# Patient Record
Sex: Female | Born: 1965 | Race: White | Hispanic: No | Smoking: Never smoker
Health system: Southern US, Community
[De-identification: ages and names within clinical notes are randomized; demographics above are authoritative.]

## PROBLEM LIST (undated history)

## (undated) DIAGNOSIS — F32A Depression, unspecified: Secondary | ICD-10-CM

## (undated) DIAGNOSIS — I472 Ventricular tachycardia, unspecified: Secondary | ICD-10-CM

## (undated) DIAGNOSIS — F329 Major depressive disorder, single episode, unspecified: Secondary | ICD-10-CM

## (undated) DIAGNOSIS — F419 Anxiety disorder, unspecified: Secondary | ICD-10-CM

## (undated) HISTORY — PX: KNEE ARTHROSCOPY: SUR90

## (undated) HISTORY — DX: Depression, unspecified: F32.A

## (undated) HISTORY — PX: LIPOSUCTION: SHX10

## (undated) HISTORY — DX: Anxiety disorder, unspecified: F41.9

---
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
## 2001-02-25 ENCOUNTER — Other Ambulatory Visit: Admission: RE | Admit: 2001-02-25 | Discharge: 2001-02-25 | Payer: Self-pay | Admitting: *Deleted

## 2004-08-20 HISTORY — PX: TUBAL LIGATION: SHX77

## 2008-08-20 HISTORY — PX: TONSILECTOMY, ADENOIDECTOMY, BILATERAL MYRINGOTOMY AND TUBES: SHX2538

## 2009-09-14 ENCOUNTER — Ambulatory Visit (HOSPITAL_BASED_OUTPATIENT_CLINIC_OR_DEPARTMENT_OTHER): Admission: RE | Admit: 2009-09-14 | Discharge: 2009-09-14 | Payer: Self-pay | Admitting: Orthopedic Surgery

## 2009-09-15 ENCOUNTER — Ambulatory Visit: Payer: Self-pay | Admitting: Surgery

## 2009-09-15 ENCOUNTER — Ambulatory Visit (HOSPITAL_COMMUNITY): Admission: RE | Admit: 2009-09-15 | Discharge: 2009-09-15 | Payer: Self-pay | Admitting: Orthopedic Surgery

## 2009-09-15 ENCOUNTER — Encounter (INDEPENDENT_AMBULATORY_CARE_PROVIDER_SITE_OTHER): Payer: Self-pay | Admitting: Orthopedic Surgery

## 2010-11-06 LAB — POCT HEMOGLOBIN-HEMACUE: Hemoglobin: 12.8 g/dL (ref 12.0–15.0)

## 2017-12-13 ENCOUNTER — Other Ambulatory Visit: Payer: Self-pay

## 2017-12-13 ENCOUNTER — Emergency Department (HOSPITAL_COMMUNITY)
Admission: EM | Admit: 2017-12-13 | Discharge: 2017-12-14 | Disposition: A | Discharge status: Home / Self Care | Payer: Medicaid Other | Attending: Emergency Medicine | Admitting: Emergency Medicine

## 2017-12-13 ENCOUNTER — Encounter (HOSPITAL_COMMUNITY): Payer: Self-pay | Admitting: Emergency Medicine

## 2017-12-13 DIAGNOSIS — Y999 Unspecified external cause status: Secondary | ICD-10-CM | POA: Insufficient documentation

## 2017-12-13 DIAGNOSIS — X58XXXA Exposure to other specified factors, initial encounter: Secondary | ICD-10-CM | POA: Diagnosis not present

## 2017-12-13 DIAGNOSIS — Y939 Activity, unspecified: Secondary | ICD-10-CM | POA: Insufficient documentation

## 2017-12-13 DIAGNOSIS — H5711 Ocular pain, right eye: Secondary | ICD-10-CM | POA: Diagnosis present

## 2017-12-13 DIAGNOSIS — S0502XA Injury of conjunctiva and corneal abrasion without foreign body, left eye, initial encounter: Secondary | ICD-10-CM

## 2017-12-13 DIAGNOSIS — Y929 Unspecified place or not applicable: Secondary | ICD-10-CM | POA: Diagnosis not present

## 2017-12-13 MED ORDER — TETRACAINE HCL 0.5 % OP SOLN
2.0000 [drp] | Freq: Once | OPHTHALMIC | Status: AC
  Administered 2017-12-14: 2 [drp] via OPHTHALMIC
  Filled 2017-12-13: qty 4

## 2017-12-13 MED ORDER — FLUORESCEIN SODIUM 1 MG OP STRP
1.0000 | ORAL_STRIP | Freq: Once | OPHTHALMIC | Status: AC
  Administered 2017-12-14: 1 via OPHTHALMIC
  Filled 2017-12-13: qty 1

## 2017-12-13 MED ORDER — GENTAMICIN SULFATE 0.3 % OP SOLN: 2.0000 [drp] | Freq: Four times a day (QID) | OPHTHALMIC | 0 refills | Status: DC

## 2017-12-13 NOTE — ED Triage Notes (Signed)
Patient thinks a paper towel brushed her eye. Now she has pain in left eye. Patient is in pain.

## 2017-12-13 NOTE — ED Provider Notes (Signed)
[facility]-EMERGENCY DEPT Provider Note   CSN: [id number] Arrival date & time: 12/13/17  2046     History   Chief Complaint Chief Complaint  Patient presents with  . Eye Pain    HPI Nashaly Dorantes is a 52 y.o. female.  Patient is a 52 year old female with no significant past medical history.  She presents for evaluation of left eye pain and irritation.  This started yesterday.  She believes that she may have scratched her eye with a paper towel.  She denies any visual disturbances.  The history is provided by the patient.  Eye Pain  This is a new problem. The current episode started yesterday. The problem occurs constantly. The problem has been rapidly worsening. Nothing aggravates the symptoms. Nothing relieves the symptoms. She has tried nothing for the symptoms.    History reviewed. No pertinent past medical history.  There are no active problems to display for this patient.   History reviewed. No pertinent surgical history.   OB History   None      Home Medications    Prior to Admission medications   Not on File    Family History History reviewed. No pertinent family history.  Social History Social History   Tobacco Use  . Smoking status: Never Smoker  . Smokeless tobacco: Never Used  Substance Use Topics  . Alcohol use: Never    Frequency: Never  . Drug use: Never     Allergies   Patient has no known allergies.   Review of Systems Review of Systems  Eyes: Positive for pain.  All other systems reviewed and are negative.    Physical Exam Updated Vital Signs BP (!) 141/83 (BP Location: Left Arm)   Pulse 95   Temp 98 F (36.7 C) (Oral)   Resp 18   Ht 5\' 7"  (1.702 m)   Wt 68 kg (150 lb)   SpO2 100%   BMI 23.49 kg/m   Physical Exam  Constitutional: She appears well-developed and well-nourished. No distress.  HENT:  Head: Normocephalic and atraumatic.  Eyes: Pupils are equal, round, and reactive to light. EOM  are normal.  The left conjunctiva is injected.  The cornea appears clear, however with fluorescein staining an abrasion is noted at approximately 5:00 on the cornea.  The anterior taper is clear.  Pupils are reactive.  Pulmonary/Chest: Effort normal.  Skin: Skin is warm. She is not diaphoretic.  Nursing note and vitals reviewed.    ED Treatments / Results  Labs (all labs ordered are listed, but only abnormal results are displayed) Labs Reviewed - No data to display  EKG None  Radiology No results found.  Procedures Procedures (including critical care time)  Medications Ordered in ED Medications  tetracaine (PONTOCAINE) 0.5 % ophthalmic solution 2 drop (has no administration in time range)  fluorescein ophthalmic strip 1 strip (has no administration in time range)     Initial Impression / Assessment and Plan / ED Course  I have reviewed the triage vital signs and the nursing notes.  Pertinent labs & imaging results that were available during my care of the patient were reviewed by me and considered in my medical decision making (see chart for details).  Patient with a corneal abrasion.  This will be treated with gentamicin and anti-inflammatory medications.  To follow-up as needed if she worsens.  Final Clinical Impressions(s) / ED Diagnoses   Final diagnoses:  None    ED Discharge Orders    None  Geoffery Lyonselo, Skyrah Krupp, MD 12/13/17 [phone]

## 2017-12-13 NOTE — Discharge Instructions (Addendum)
Gentamicin drops as prescribed.  Ibuprofen 600 mg every 6 hours as needed for pain.  Follow-up with your eye doctor if symptoms are not improving in the next 2 to 3 days, and return to the ER if symptoms significantly worsen or change.

## 2018-08-25 ENCOUNTER — Other Ambulatory Visit: Payer: Self-pay | Admitting: Obstetrics and Gynecology

## 2018-08-25 ENCOUNTER — Encounter: Payer: Self-pay | Admitting: Obstetrics and Gynecology

## 2018-08-25 ENCOUNTER — Other Ambulatory Visit (HOSPITAL_COMMUNITY)
Admission: RE | Admit: 2018-08-25 | Discharge: 2018-08-25 | Disposition: A | Discharge status: Home / Self Care | Payer: Medicaid Other | Source: Ambulatory Visit | Attending: Obstetrics and Gynecology | Admitting: Obstetrics and Gynecology

## 2018-08-25 ENCOUNTER — Ambulatory Visit: Payer: Medicaid Other | Admitting: Obstetrics and Gynecology

## 2018-08-25 VITALS — BP 116/82 | Ht 66.0 in | Wt 161.9 lb

## 2018-08-25 DIAGNOSIS — Z01419 Encounter for gynecological examination (general) (routine) without abnormal findings: Secondary | ICD-10-CM | POA: Diagnosis not present

## 2018-08-25 DIAGNOSIS — Z1231 Encounter for screening mammogram for malignant neoplasm of breast: Secondary | ICD-10-CM

## 2018-08-25 DIAGNOSIS — Z0000 Encounter for general adult medical examination without abnormal findings: Secondary | ICD-10-CM

## 2018-08-25 NOTE — Progress Notes (Signed)
Pt presents for annual, pap, all STD testing.  Last MGM 2012 Never had colonoscopy

## 2018-08-25 NOTE — Progress Notes (Signed)
Subjective:     Marissa Barnes is a 53 y.o. female with BMI 26 and postmenopausal for the past 2 years who is here for a comprehensive physical exam. The patient reports no problems. She is occasionally sexually active without complaints. She denies pelvic pain or abnormal discharge. She denies urinary incontinence. She denies any postmenopausal vaginal bleeding  History reviewed. No pertinent past medical history. Past Surgical History:  Procedure Laterality Date  . LIPOSUCTION    . TONSILECTOMY, ADENOIDECTOMY, BILATERAL MYRINGOTOMY AND TUBES  2010  . TUBAL LIGATION  2006   Family History  Problem Relation Age of Onset  . Atrial fibrillation Mother   . Diabetes Mother   . Amblyopia Mother   . Breast cancer Mother   . Heart disease Father   . Breast cancer Sister     Social History   Socioeconomic History  . Marital status: Legally Separated    Spouse name: Not on file  . Number of children: Not on file  . Years of education: Not on file  . Highest education level: Not on file  Occupational History  . Not on file  Social Needs  . Financial resource strain: Not on file  . Food insecurity:    Worry: Not on file    Inability: Not on file  . Transportation needs:    Medical: Not on file    Non-medical: Not on file  Tobacco Use  . Smoking status: Never Smoker  . Smokeless tobacco: Never Used  Substance and Sexual Activity  . Alcohol use: Never    Frequency: Never  . Drug use: Never  . Sexual activity: Not Currently    Birth control/protection: Surgical  Lifestyle  . Physical activity:    Days per week: Not on file    Minutes per session: Not on file  . Stress: Not on file  Relationships  . Social connections:    Talks on phone: Not on file    Gets together: Not on file    Attends religious service: Not on file    Active member of club or organization: Not on file    Attends meetings of clubs or organizations: Not on file    Relationship status: Not on file  .  Intimate partner violence:    Fear of current or ex partner: Not on file    Emotionally abused: Not on file    Physically abused: Not on file    Forced sexual activity: Not on file  Other Topics Concern  . Not on file  Social History Narrative  . Not on file   Health Maintenance  Topic Date Due  . HIV Screening  10/30/1980  . TETANUS/TDAP  10/30/1984  . PAP SMEAR-Modifier  10/31/1986  . MAMMOGRAM  10/31/2015  . COLONOSCOPY  10/31/2015  . INFLUENZA VACCINE  03/20/2018    Review of Systems Pertinent items are noted in HPI.   Objective:  Blood pressure 116/82, height 5\' 6"  (1.676 m), weight 161 lb 14.4 oz (73.4 kg).     GENERAL: Well-developed, well-nourished female in no acute distress.  HEENT: Normocephalic, atraumatic. Sclerae anicteric.  NECK: Supple. Normal thyroid.  LUNGS: Clear to auscultation bilaterally.  HEART: Regular rate and rhythm. BREASTS: Symmetric in size. No palpable masses or lymphadenopathy, skin changes, or nipple drainage. ABDOMEN: Soft, nontender, nondistended. No organomegaly. PELVIC: Normal external female genitalia. Vagina is pink and rugated.  Normal discharge. Normal appearing cervix. Uterus is normal in size. No adnexal mass or tenderness. EXTREMITIES: No cyanosis, clubbing,  or edema, 2+ distal pulses.    Assessment:    Healthy female exam.      Plan:    pap smear collected Screening mammogram ordered Referral to GI for colonoscopy made STI screening per patient request Patient will be contacted with abnormal results See After Visit Summary for Counseling Recommendations

## 2018-08-25 NOTE — Addendum Note (Signed)
Addended by: Catalina Antigua on: 08/25/2018 01:57 PM   Modules accepted: Orders

## 2018-08-26 LAB — COMPREHENSIVE METABOLIC PANEL WITH GFR
ALT: 14 [IU]/L (ref 0–32)
AST: 18 [IU]/L (ref 0–40)
Albumin/Globulin Ratio: 1.8 (ref 1.2–2.2)
Albumin: 4.5 g/dL (ref 3.5–5.5)
Alkaline Phosphatase: 121 [IU]/L — ABNORMAL HIGH (ref 39–117)
BUN/Creatinine Ratio: 16 (ref 9–23)
BUN: 11 mg/dL (ref 6–24)
Bilirubin Total: 0.3 mg/dL (ref 0.0–1.2)
CO2: 24 mmol/L (ref 20–29)
Calcium: 9.3 mg/dL (ref 8.7–10.2)
Chloride: 103 mmol/L (ref 96–106)
Creatinine, Ser: 0.7 mg/dL (ref 0.57–1.00)
GFR calc Af Amer: 115 mL/min/{1.73_m2}
GFR calc non Af Amer: 100 mL/min/{1.73_m2}
Globulin, Total: 2.5 g/dL (ref 1.5–4.5)
Glucose: 91 mg/dL (ref 65–99)
Potassium: 4.2 mmol/L (ref 3.5–5.2)
Sodium: 142 mmol/L (ref 134–144)
Total Protein: 7 g/dL (ref 6.0–8.5)

## 2018-08-26 LAB — HIV ANTIBODY (ROUTINE TESTING W REFLEX): HIV Screen 4th Generation wRfx: NONREACTIVE

## 2018-08-26 LAB — HEPATITIS B SURFACE ANTIGEN: Hepatitis B Surface Ag: NEGATIVE

## 2018-08-26 LAB — CBC
Hematocrit: 37.4 % (ref 34.0–46.6)
Hemoglobin: 12.3 g/dL (ref 11.1–15.9)
MCH: 28 pg (ref 26.6–33.0)
MCHC: 32.9 g/dL (ref 31.5–35.7)
MCV: 85 fL (ref 79–97)
Platelets: 236 10*3/uL (ref 150–450)
RBC: 4.4 x10E6/uL (ref 3.77–5.28)
RDW: 12.7 % (ref 11.7–15.4)
WBC: 6.6 10*3/uL (ref 3.4–10.8)

## 2018-08-26 LAB — HEMOGLOBIN A1C
ESTIMATED AVERAGE GLUCOSE: 111 mg/dL
Hgb A1c MFr Bld: 5.5 % (ref 4.8–5.6)

## 2018-08-26 LAB — RPR: RPR Ser Ql: NONREACTIVE

## 2018-08-26 LAB — HEPATITIS C ANTIBODY: Hep C Virus Ab: <0.1 s/co ratio (ref 0.0–0.9)

## 2018-08-26 LAB — TSH: TSH: 2.61 u[IU]/mL (ref 0.450–4.500)

## 2018-08-27 LAB — CYTOLOGY - PAP
Chlamydia: NEGATIVE
Diagnosis: NEGATIVE
HPV (WINDOPATH): NOT DETECTED
Neisseria Gonorrhea: NEGATIVE

## 2018-09-23 ENCOUNTER — Ambulatory Visit
Admission: RE | Admit: 2018-09-23 | Discharge: 2018-09-23 | Disposition: A | Discharge status: Home / Self Care | Payer: Medicaid Other | Source: Ambulatory Visit | Attending: Obstetrics and Gynecology | Admitting: Obstetrics and Gynecology

## 2018-09-23 DIAGNOSIS — Z1231 Encounter for screening mammogram for malignant neoplasm of breast: Secondary | ICD-10-CM

## 2019-02-05 ENCOUNTER — Encounter: Payer: Self-pay | Admitting: Gastroenterology

## 2019-02-26 ENCOUNTER — Other Ambulatory Visit: Payer: Self-pay

## 2019-02-26 ENCOUNTER — Ambulatory Visit (AMBULATORY_SURGERY_CENTER): Payer: Self-pay | Admitting: *Deleted

## 2019-02-26 VITALS — Ht 67.0 in | Wt 160.0 lb

## 2019-02-26 DIAGNOSIS — Z1211 Encounter for screening for malignant neoplasm of colon: Secondary | ICD-10-CM

## 2019-02-26 MED ORDER — PEG 3350-KCL-NA BICARB-NACL 420 G PO SOLR: 4000.0000 mL | Freq: Once | ORAL | 0 refills | Status: AC

## 2019-02-26 NOTE — Progress Notes (Signed)
Patient's pre-visit was done today over the phone with the patient due to COVID-19 pandemic. Name,DOB and address verified. Insurance verified. Packet of Prep instructions mailed to patient including copy of a consent form and pre-procedure patient acknowledgement form-pt is aware. Patient understands to call us back with any questions or concerns. Patient denies any allergies to eggs or soy. Patient denies any problems with anesthesia/sedation. Patient denies any oxygen use at home. Patient denies taking any diet/weight loss medications or blood thinners. EMMI education assisgned to patient on colonoscopy, this was explained and instructions given to patient. Pt is aware that care partner will wait in the car during proceudre; if they feel like they will be too hot to wait in the car; they may wait in the lobby.  We want them to wear a mask (we do not have any that we can provide them), practice social distancing, and we will check their temperatures when they get here.  I did remind patient that their care partner needs to stay in the parking lot the entire time. Pt will wear mask into building. 

## 2019-03-11 ENCOUNTER — Telehealth: Payer: Self-pay | Admitting: Gastroenterology

## 2019-03-11 NOTE — Telephone Encounter (Signed)

## 2019-03-11 NOTE — Telephone Encounter (Signed)
Patient called back and answered not to all covid-19 questions.

## 2019-03-12 ENCOUNTER — Telehealth: Payer: Self-pay

## 2019-03-12 ENCOUNTER — Telehealth: Payer: Self-pay | Admitting: Gastroenterology

## 2019-03-12 ENCOUNTER — Encounter: Payer: Medicaid Other | Admitting: Gastroenterology

## 2019-03-12 NOTE — Telephone Encounter (Signed)
The pt has been scheduled for an office visit to discuss colonoscopy and bowel regimen. 8/25.  She has been notified

## 2019-03-12 NOTE — Telephone Encounter (Signed)
Patient is scheduled to have a colonoscopy today apt is at 10am. She called stating she has still not had a BM and would like to speak to someone

## 2019-03-12 NOTE — Telephone Encounter (Signed)
Patient called and said she had done 3/4 of her prep (golytely) and taken the 4 ducolax plus 2 additional ducolax with zero results. After talking to Dr. Rush Landmark about this patient he said we will have to cancel todays procedure and that she needs to be seen in the office before scheduling another colonoscopy because she will need a 2 day prep. Patient agreed and will call the office.

## 2019-03-12 NOTE — Telephone Encounter (Signed)
-----   Message from [name]., MD sent at 03/12/2019  8:35 AM EDT ----- Regarding: Follow-up Marissa Barnes,This patient did her preparation and was not able to actually have a bowel movement.Our endoscopy RN team talked with the patient this morning and unfortunately she still had not had a bowel movement.She will need a 2-day preparation prior to scheduling her next colonoscopy and/or we need to see her in clinic to optimize her bowel habits first with aggressive bowel regimen daily and subsequently then get her set up for a 2-day preparation.Please reach out to her later today or tomorrow and see what she would like to do so that we can hopefully minimize having to reschedule another colonoscopy.Thank you.GM

## 2019-04-14 ENCOUNTER — Ambulatory Visit: Payer: Medicaid Other | Admitting: Gastroenterology

## 2019-05-13 NOTE — Patient Instructions (Addendum)
DUE TO COVID-19 ONLY ONE VISITOR IS ALLOWED TO COME WITH YOU AND STAY IN THE WAITING ROOM ONLY DURING PRE OP AND PROCEDURE DAY OF SURGERY.  THE 1 VISITOR MAY VISIT WITH YOU AFTER SURGERY IN YOUR PRIVATE ROOM DURING VISITING HOURS ONLY!  YOU NEED TO HAVE A COVID 19 TEST ON_Saturday 9/26/20______ @_12 :25 pm______, THIS TEST MUST BE DONE BEFORE SURGERY, COME  [address],  [place] , .  [facility])  ONCE YOUR COVID TEST IS COMPLETED, PLEASE BEGIN THE QUARANTINE INSTRUCTIONS AS OUTLINED IN YOUR HANDOUT.                [facility]   Your procedure is scheduled on: Wednesday 05/20/19   Report to [facility] Main  Entrance Report to admitting at  1:30 PM     Call this number if you have problems the morning of surgery [phone]   . BRUSH YOUR TEETH MORNING OF SURGERY AND RINSE YOUR MOUTH OUT, NO CHEWING GUM CANDY OR MINTS.   Do not eat food After Midnight.   YOU MAY HAVE CLEAR LIQUIDS FROM MIDNIGHT UNTIL 1:00 PM.    CLEAR LIQUID DIET   Foods Allowed                                                                     Foods Excluded  Coffee and tea, regular and decaf                             liquids that you cannot  Plain Jell-O any favor except red or purple                                           see through such as: Fruit ices (not with fruit pulp)                                     milk, soups, orange juice  Iced Popsicles                                    All solid food Carbonated beverages, regular and diet                                    Cranberry, grape and apple juices Sports drinks like Gatorade Lightly seasoned clear broth or consume(fat free) Sugar, honey syrup   At  1:00 PM  Please finish the prescribed Pre-Surgery drink.    Nothing by mouth after you finish the  drink !    Take these medicines the morning of surgery with A SIP OF WATER: Paxil, Xanax if needed                                 You may not  have any metal  on your body including hair pins and piercings             Do not wear jewelry, make-up, lotions, powders or perfumes, deodorant             Do not wear nail polish on your fingernails.  Do not shave  48 hours prior to surgery.  .   Do not bring valuables to the hospital. [facility] IS NOT             RESPONSIBLE   FOR VALUABLES.  Contacts, dentures or bridgework may not be worn into surgery.        Name and phone number of your driver:  Special Instructions: N/A              Please read over the following fact sheets you were given: _____________________________________________________________________             [facility] - Preparing for Surgery  Before surgery, you can play an important role.   Because skin is not sterile, your skin needs to be as free of germs as possible.   You can reduce the number of germs on your skin by washing with CHG (chlorahexidine gluconate) soap before surgery.   CHG is an antiseptic cleaner which kills germs and bonds with the skin to continue killing germs even after washing. Please DO NOT use if you have an allergy to CHG or antibacterial soaps.   If your skin becomes reddened/irritated stop using the CHG and inform your nurse when you arrive at Short Stay. Do not shave (including legs and underarms) for at least 48 hours prior to the first CHG shower.   Please follow these instructions carefully:  1.  Shower with CHG Soap the night before surgery and the  morning of Surgery.  2.  If you choose to wash your hair, wash your hair first as usual with your  normal  shampoo.  3.  After you shampoo, rinse your hair and body thoroughly to remove the  shampoo.                                        4.  Use CHG as you would any other liquid soap.  You can apply chg directly  to the skin and wash                       Gently with a scrungie or clean washcloth.  5.  Apply the CHG Soap to your body ONLY FROM THE NECK DOWN.   Do not use on  face/ open                           Wound or open sores. Avoid contact with eyes, ears mouth and genitals (private parts).                       Wash face,  Genitals (private parts) with your normal soap.             6.  Wash thoroughly, paying special attention to the area where your surgery  will be performed.  7.  Thoroughly rinse your body with warm water from the neck down.  8.  DO NOT shower/wash with your normal soap after using and rinsing off  the CHG Soap.  9.  Pat yourself dry with a clean towel.            10.  Wear clean pajamas.            11.  Place clean sheets on your bed the night of your first shower and do not  sleep with pets.  Day of Surgery : Do not apply any lotions/deodorants the morning of surgery.  Please wear clean clothes to the hospital/surgery center.   FAILURE TO FOLLOW THESE INSTRUCTIONS MAY RESULT IN THE CANCELLATION OF YOUR SURGERY PATIENT SIGNATURE_________________________________  NURSE SIGNATURE__________________________________  ________________________________________________________________________   Marissa Barnes  An incentive spirometer is a tool that can help keep your lungs clear and active. This tool measures how well you are filling your lungs with each breath. Taking long deep breaths may help reverse or decrease the chance of developing breathing (pulmonary) problems (especially infection) following:  A long period of time when you are unable to move or be active. BEFORE THE PROCEDURE   If the spirometer includes an indicator to show your best effort, your nurse or respiratory therapist will set it to a desired goal.  If possible, sit up straight or lean slightly forward. Try not to slouch.  Hold the incentive spirometer in an upright position. INSTRUCTIONS FOR USE  1. Sit on the edge of your bed if possible, or sit up as far as you can in bed or on a chair. 2. Hold the incentive spirometer in an upright  position. 3. Breathe out normally. 4. Place the mouthpiece in your mouth and seal your lips tightly around it. 5. Breathe in slowly and as deeply as possible, raising the piston or the ball toward the top of the column. 6. Hold your breath for 3-5 seconds or for as long as possible. Allow the piston or ball to fall to the bottom of the column. 7. Remove the mouthpiece from your mouth and breathe out normally. 8. Rest for a few seconds and repeat Steps 1 through 7 at least 10 times every 1-2 hours when you are awake. Take your time and take a few normal breaths between deep breaths. 9. The spirometer may include an indicator to show your best effort. Use the indicator as a goal to work toward during each repetition. 10. After each set of 10 deep breaths, practice coughing to be sure your lungs are clear. If you have an incision (the cut made at the time of surgery), support your incision when coughing by placing a pillow or rolled up towels firmly against it. Once you are able to get out of bed, walk around indoors and cough well. You may stop using the incentive spirometer when instructed by your caregiver.  RISKS AND COMPLICATIONS  Take your time so you do not get dizzy or light-headed.  If you are in pain, you may need to take or ask for pain medication before doing incentive spirometry. It is harder to take a deep breath if you are having pain. AFTER USE  Rest and breathe slowly and easily.  It can be helpful to keep track of a log of your progress. Your caregiver can provide you with a simple table to help with this. If you are using the spirometer at home, follow these instructions: [place] IF:   You are having difficultly using the spirometer.  You have trouble using the spirometer as often as instructed.  Your pain medication is not giving enough relief while using the spirometer.  You develop fever of 100.5 F (38.1 C) or higher. SEEK IMMEDIATE MEDICAL CARE IF:    You cough up bloody sputum that had not been present before.  You develop fever of 102 F (38.9 C) or greater.  You develop worsening pain at or near the incision site. MAKE SURE YOU:   Understand these instructions.  Will watch your condition.  Will get help right away if you are not doing well or get worse. Document Released: 12/17/2006 Document Revised: 10/29/2011 Document Reviewed: 02/17/2007 ExitCare Patient Information 2014 ExitCare, [place].   ________________________________________________________________________  WHAT IS A BLOOD TRANSFUSION? Blood Transfusion Information  A transfusion is the replacement of blood or some of its parts. Blood is made up of multiple cells which provide different functions.  Red blood cells carry oxygen and are used for blood loss replacement.  White blood cells fight against infection.  Platelets control bleeding.  Plasma helps clot blood.  Other blood products are available for specialized needs, such as hemophilia or other clotting disorders. BEFORE THE TRANSFUSION  Who gives blood for transfusions?   Healthy volunteers who are fully evaluated to make sure their blood is safe. This is blood bank blood. Transfusion therapy is the safest it has ever been in the practice of medicine. Before blood is taken from a donor, a complete history is taken to make sure that person has no history of diseases nor engages in risky social behavior (examples are intravenous drug use or sexual activity with multiple partners). The donor's travel history is screened to minimize risk of transmitting infections, such as malaria. The donated blood is tested for signs of infectious diseases, such as HIV and hepatitis. The blood is then tested to be sure it is compatible with you in order to minimize the chance of a transfusion reaction. If you or a relative donates blood, this is often done in anticipation of surgery and is not appropriate for emergency  situations. It takes many days to process the donated blood. RISKS AND COMPLICATIONS Although transfusion therapy is very safe and saves many lives, the main dangers of transfusion include:   Getting an infectious disease.  Developing a transfusion reaction. This is an allergic reaction to something in the blood you were given. Every precaution is taken to prevent this. The decision to have a blood transfusion has been considered carefully by your caregiver before blood is given. Blood is not given unless the benefits outweigh the risks. AFTER THE TRANSFUSION  Right after receiving a blood transfusion, you will usually feel much better and more energetic. This is especially true if your red blood cells have gotten low (anemic). The transfusion raises the level of the red blood cells which carry oxygen, and this usually causes an energy increase.  The nurse administering the transfusion will monitor you carefully for complications. HOME CARE INSTRUCTIONS  No special instructions are needed after a transfusion. You may find your energy is better. Speak with your caregiver about any limitations on activity for underlying diseases you may have. SEEK MEDICAL CARE IF:   Your condition is not improving after your transfusion.  You develop redness or irritation at the intravenous (IV) site. SEEK IMMEDIATE MEDICAL CARE IF:  Any of the following symptoms occur over the next 12 hours:  Shaking chills.  You have a temperature by mouth above 102 F (38.9 C), not controlled by medicine.  Chest, back, or muscle pain.  People around you feel you are not acting correctly or are confused.  Shortness  of breath or difficulty breathing.  Dizziness and fainting.  You get a rash or develop hives.  You have a decrease in urine output.  Your urine turns a dark color or changes to pink, red, or brown. Any of the following symptoms occur over the next 10 days:  You have a temperature by mouth above  102 F (38.9 C), not controlled by medicine.  Shortness of breath.  Weakness after normal activity.  The white part of the eye turns yellow (jaundice).  You have a decrease in the amount of urine or are urinating less often.  Your urine turns a dark color or changes to pink, red, or brown. Document Released: 08/03/2000 Document Revised: 10/29/2011 Document Reviewed: 03/22/2008 [facility] Patient Information 2014 [employer], [place].  _______________________________________________________________________

## 2019-05-14 ENCOUNTER — Encounter (HOSPITAL_COMMUNITY): Payer: Self-pay

## 2019-05-14 ENCOUNTER — Encounter (HOSPITAL_COMMUNITY)
Admission: RE | Admit: 2019-05-14 | Discharge: 2019-05-14 | Disposition: A | Discharge status: Home / Self Care | Payer: Medicaid Other | Source: Ambulatory Visit | Attending: Orthopedic Surgery | Admitting: Orthopedic Surgery

## 2019-05-14 ENCOUNTER — Other Ambulatory Visit: Payer: Self-pay

## 2019-05-14 DIAGNOSIS — Z01812 Encounter for preprocedural laboratory examination: Secondary | ICD-10-CM | POA: Insufficient documentation

## 2019-05-14 DIAGNOSIS — M1611 Unilateral primary osteoarthritis, right hip: Secondary | ICD-10-CM | POA: Insufficient documentation

## 2019-05-14 LAB — COMPREHENSIVE METABOLIC PANEL
ALT: 15 U/L (ref 0–44)
AST: 18 U/L (ref 15–41)
Albumin: 4.2 g/dL (ref 3.5–5.0)
Alkaline Phosphatase: 110 U/L (ref 38–126)
Anion gap: 8 (ref 5–15)
BUN: 13 mg/dL (ref 6–20)
CO2: 28 mmol/L (ref 22–32)
Calcium: 9.3 mg/dL (ref 8.9–10.3)
Chloride: 103 mmol/L (ref 98–111)
Creatinine, Ser: 0.67 mg/dL (ref 0.44–1.00)
GFR calc Af Amer: >60 mL/min (ref >60)
GFR calc non Af Amer: >60 mL/min (ref >60)
Glucose, Bld: 106 mg/dL — ABNORMAL HIGH (ref 70–99)
Potassium: 4.2 mmol/L (ref 3.5–5.1)
Sodium: 139 mmol/L (ref 135–145)
Total Bilirubin: 0.6 mg/dL (ref 0.3–1.2)
Total Protein: 7.4 g/dL (ref 6.5–8.1)

## 2019-05-14 LAB — CBC
HCT: 38.7 % (ref 36.0–46.0)
Hemoglobin: 12.8 g/dL (ref 12.0–15.0)
MCH: 28.8 pg (ref 26.0–34.0)
MCHC: 33.1 g/dL (ref 30.0–36.0)
MCV: 87 fL (ref 80.0–100.0)
Platelets: 244 10*3/uL (ref 150–400)
RBC: 4.45 MIL/uL (ref 3.87–5.11)
RDW: 13 % (ref 11.5–15.5)
WBC: 5.8 10*3/uL (ref 4.0–10.5)
nRBC: 0 % (ref 0.0–0.2)

## 2019-05-14 LAB — PROTIME-INR
INR: 1 (ref 0.8–1.2)
Prothrombin Time: 13 seconds (ref 11.4–15.2)

## 2019-05-14 LAB — APTT: aPTT: 30 seconds (ref 24–36)

## 2019-05-14 LAB — SURGICAL PCR SCREEN
MRSA, PCR: NEGATIVE
Staphylococcus aureus: POSITIVE — AB

## 2019-05-15 LAB — ABO/RH: ABO/RH(D): O POS

## 2019-05-16 ENCOUNTER — Other Ambulatory Visit (HOSPITAL_COMMUNITY)
Admission: RE | Admit: 2019-05-16 | Discharge: 2019-05-16 | Disposition: A | Discharge status: Home / Self Care | Payer: Medicaid Other | Source: Ambulatory Visit | Attending: Orthopedic Surgery | Admitting: Orthopedic Surgery

## 2019-05-16 DIAGNOSIS — Z01812 Encounter for preprocedural laboratory examination: Secondary | ICD-10-CM | POA: Insufficient documentation

## 2019-05-16 DIAGNOSIS — Z20828 Contact with and (suspected) exposure to other viral communicable diseases: Secondary | ICD-10-CM | POA: Diagnosis not present

## 2019-05-17 LAB — NOVEL CORONAVIRUS, NAA (HOSP ORDER, SEND-OUT TO REF LAB; TAT 18-24 HRS): SARS-CoV-2, NAA: NOT DETECTED

## 2019-05-18 NOTE — H&P (Addendum)
TOTAL HIP ADMISSION H&P  Patient is admitted for right total hip arthroplasty.  Subjective:  Chief Complaint: right hip pain  HPI: Marissa Barnes, 53 y.o. female, has a history of pain and functional disability in the right hip(s) due to arthritis and congenital hip dysplasia and patient has failed non-surgical conservative treatments for greater than 12 weeks to include NSAID's and/or analgesics, use of assistive devices and activity modification.  Onset of symptoms was gradual starting 3 years ago with gradually worsening course since that time.The patient noted no past surgery on the right hip(s).  Patient currently rates pain in the right hip at 9 out of 10 with activity. Patient has night pain, worsening of pain with activity and weight bearing and pain that interfers with activities of daily living. 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 This condition presents safety issues increasing the risk of falls.  There is no current active infection.  There are no active problems to display for this patient.  Past Medical History:  Diagnosis Date  . Anxiety   . Depression     Past Surgical History:  Procedure Laterality Date  . KNEE ARTHROSCOPY    . LIPOSUCTION    . TONSILECTOMY, ADENOIDECTOMY, BILATERAL MYRINGOTOMY AND TUBES  2010  . TUBAL LIGATION  2006    No current facility-administered medications for this encounter.    Current Outpatient Medications  Medication Sig Dispense Refill Last Dose  . ALPRAZolam (XANAX) 0.5 MG tablet Take 0.5 mg by mouth 2 (two) times daily as needed for anxiety.      Marland Kitchen amphetamine-dextroamphetamine (ADDERALL) 30 MG tablet Take 30 mg by mouth 3 (three) times daily.      Marland Kitchen PARoxetine (PAXIL) 40 MG tablet Take 40 mg by mouth daily.      . polyethylene glycol (MIRALAX / GLYCOLAX) 17 g packet Take 17 g by mouth daily.      No Known Allergies   Social History   Tobacco Use  . Smoking status: Never Smoker  . Smokeless tobacco: Never Used  Substance Use Topics  . Alcohol use: Yes    Frequency: Never    Comment: occ    Family History  Problem Relation Age of Onset  . Atrial fibrillation Mother   . Diabetes Mother   . Amblyopia Mother   . Breast cancer Mother   . Colon polyps Mother   . Heart disease Father   . Breast cancer Sister   . Colon cancer Neg Hx   . Esophageal cancer Neg Hx   . Stomach cancer Neg Hx   . Rectal cancer Neg Hx      Review of Systems  Constitutional: Negative for chills and fever.  Respiratory: Negative for cough and sputum production.   Cardiovascular: Negative for chest pain and palpitations.  Gastrointestinal: Negative for nausea and vomiting.  Musculoskeletal: Positive for joint pain.  Neurological: Negative for sensory change and speech change.   Objective:  Physical Exam Patient is a 53 year old female.  Well nourished and well developed. Significantly antalgic gait, ambulating with crutches.  General: Alert and oriented x3, cooperative and pleasant, no acute distress. Head: normocephalic, atraumatic, neck supple. Eyes: EOMI. Respiratory: breath sounds clear in all fields, no wheezing, rales, or rhonchi. Cardiovascular: Regular rate and rhythm, no murmurs, gallops or rubs. Abdomen: non-tender to palpation and soft, normoactive bowel sounds.  Musculoskeletal: Right Hip Exam: ROM: Flexion to 100, Internal Rotation 0, External  Rotation 0, and Abduction 20 degrees. There is no tenderness over the greater trochanter bursa  Calves soft and nontender. Motor function intact in LE. Strength 5/5 LE bilaterally. Neuro: Distal pulses 2+. Sensation to light touch intact in LE.  Vital signs in last 24 hours:   Labs:  Estimated body mass index is 24.28 kg/m as calculated from the following:   Height as of 05/14/19: 5\' 7"  (1.702 m).   Weight as of 05/14/19: 70.3 kg.   Imaging Review  Plain radiographs demonstrate severe degenerative joint disease of the right hip(s). The bone quality appears to be adequate for age and reported activity level.  Assessment/Plan:  End stage arthritis, right hip(s)  The patient history, physical examination, clinical judgement of the provider and imaging studies are consistent with end stage degenerative joint disease of the right hip(s) and total hip arthroplasty is deemed medically necessary. The treatment options including medical management, injection therapy, arthroscopy and arthroplasty were discussed at length. The risks and benefits of total hip arthroplasty were presented and reviewed. The risks due to aseptic loosening, infection, stiffness, dislocation/subluxation,  thromboembolic complications and other imponderables were discussed.  The patient acknowledged the explanation, agreed to proceed with the plan and consent was signed. Patient is being admitted for inpatient treatment for surgery, pain control, PT, OT, prophylactic antibiotics, VTE prophylaxis, progressive ambulation and ADL's and discharge planning.The patient is planning to be discharged home.   Therapy Plans: HEP Disposition: Home with children  Planned DVT Prophylaxis: aspirin 325mg  BID DME needed: walker PCP: 05/16/19, clearance received TXA: IV Allergies: NKDA Anesthesia Concerns: intra-operative aspiration with right knee surgery BMI: 24.5  Other: none  - Patient was instructed on what medications to stop prior to surgery. - Follow-up visit in 2 weeks with Dr. - Begin physical therapy following surgery - Pre-operative lab work as pre-surgical testing - Prescriptions will be provided in hospital at time of discharge  Teresa Coombs, PA-C Orthopedic Surgery [facility] [place] [phone]

## 2019-05-20 ENCOUNTER — Encounter (HOSPITAL_COMMUNITY): Payer: Self-pay | Admitting: *Deleted

## 2019-05-20 ENCOUNTER — Other Ambulatory Visit: Payer: Self-pay

## 2019-05-20 ENCOUNTER — Inpatient Hospital Stay (HOSPITAL_COMMUNITY): Payer: Medicaid Other

## 2019-05-20 ENCOUNTER — Encounter (HOSPITAL_COMMUNITY)
Admission: RE | Disposition: A | Discharge status: Home / Self Care | Payer: Self-pay | Source: Home / Self Care | Attending: Orthopedic Surgery

## 2019-05-20 ENCOUNTER — Inpatient Hospital Stay (HOSPITAL_COMMUNITY): Payer: Medicaid Other | Admitting: Physician Assistant

## 2019-05-20 ENCOUNTER — Observation Stay (HOSPITAL_COMMUNITY)
Admission: RE | Admit: 2019-05-20 | Discharge: 2019-05-21 | Disposition: A | Discharge status: Home / Self Care | Payer: Medicaid Other | Attending: Orthopedic Surgery | Admitting: Orthopedic Surgery

## 2019-05-20 ENCOUNTER — Inpatient Hospital Stay (HOSPITAL_COMMUNITY): Payer: Medicaid Other | Admitting: Anesthesiology

## 2019-05-20 DIAGNOSIS — F419 Anxiety disorder, unspecified: Secondary | ICD-10-CM | POA: Insufficient documentation

## 2019-05-20 DIAGNOSIS — M169 Osteoarthritis of hip, unspecified: Secondary | ICD-10-CM | POA: Diagnosis present

## 2019-05-20 DIAGNOSIS — Z96641 Presence of right artificial hip joint: Secondary | ICD-10-CM

## 2019-05-20 DIAGNOSIS — Z419 Encounter for procedure for purposes other than remedying health state, unspecified: Secondary | ICD-10-CM

## 2019-05-20 DIAGNOSIS — M25751 Osteophyte, right hip: Secondary | ICD-10-CM | POA: Diagnosis not present

## 2019-05-20 DIAGNOSIS — M25551 Pain in right hip: Secondary | ICD-10-CM

## 2019-05-20 DIAGNOSIS — Y831 Surgical operation with implant of artificial internal device as the cause of abnormal reaction of the patient, or of later complication, without mention of misadventure at the time of the procedure: Secondary | ICD-10-CM | POA: Diagnosis not present

## 2019-05-20 DIAGNOSIS — R52 Pain, unspecified: Secondary | ICD-10-CM

## 2019-05-20 DIAGNOSIS — T84020A Dislocation of internal right hip prosthesis, initial encounter: Secondary | ICD-10-CM | POA: Diagnosis not present

## 2019-05-20 DIAGNOSIS — F329 Major depressive disorder, single episode, unspecified: Secondary | ICD-10-CM | POA: Insufficient documentation

## 2019-05-20 DIAGNOSIS — M1611 Unilateral primary osteoarthritis, right hip: Secondary | ICD-10-CM | POA: Diagnosis not present

## 2019-05-20 DIAGNOSIS — Z96649 Presence of unspecified artificial hip joint: Secondary | ICD-10-CM

## 2019-05-20 HISTORY — PX: TOTAL HIP ARTHROPLASTY: SHX124

## 2019-05-20 LAB — TYPE AND SCREEN
ABO/RH(D): O POS
Antibody Screen: NEGATIVE

## 2019-05-20 SURGERY — ARTHROPLASTY, HIP, TOTAL, ANTERIOR APPROACH
Anesthesia: Spinal | Site: Hip | Laterality: Right

## 2019-05-20 MED ORDER — POVIDONE-IODINE 10 % EX SWAB
2.0000 "application " | Freq: Once | CUTANEOUS | Status: AC
  Administered 2019-05-20: 2 via TOPICAL

## 2019-05-20 MED ORDER — MENTHOL 3 MG MT LOZG: 1.0000 | LOZENGE | OROMUCOSAL | Status: DC | PRN

## 2019-05-20 MED ORDER — METHOCARBAMOL 500 MG IVPB - SIMPLE MED
INTRAVENOUS | Status: AC
  Filled 2019-05-20: qty 50

## 2019-05-20 MED ORDER — HYDROCODONE-ACETAMINOPHEN 5-325 MG PO TABS: 1.0000 | ORAL_TABLET | ORAL | Status: DC | PRN

## 2019-05-20 MED ORDER — HYDROMORPHONE HCL 1 MG/ML IJ SOLN
INTRAMUSCULAR | Status: AC
  Filled 2019-05-20: qty 1

## 2019-05-20 MED ORDER — ONDANSETRON HCL 4 MG PO TABS: 4.0000 mg | ORAL_TABLET | Freq: Four times a day (QID) | ORAL | Status: DC | PRN

## 2019-05-20 MED ORDER — AMPHETAMINE-DEXTROAMPHETAMINE 10 MG PO TABS
30.0000 mg | ORAL_TABLET | Freq: Three times a day (TID) | ORAL | Status: DC
  Filled 2019-05-20: qty 3

## 2019-05-20 MED ORDER — PHENYLEPHRINE HCL (PRESSORS) 10 MG/ML IV SOLN
INTRAVENOUS | Status: DC | PRN
  Administered 2019-05-20 (×2): 80 ug via INTRAVENOUS

## 2019-05-20 MED ORDER — TRANEXAMIC ACID-NACL 1000-0.7 MG/100ML-% IV SOLN
1000.0000 mg | INTRAVENOUS | Status: AC
  Administered 2019-05-20: 13:00:00 1000 mg via INTRAVENOUS
  Filled 2019-05-20: qty 100

## 2019-05-20 MED ORDER — BUPIVACAINE HCL 0.25 % IJ SOLN
INTRAMUSCULAR | Status: DC | PRN
  Administered 2019-05-20: 30 mL via INTRA_ARTICULAR

## 2019-05-20 MED ORDER — METHOCARBAMOL 500 MG IVPB - SIMPLE MED
500.0000 mg | Freq: Four times a day (QID) | INTRAVENOUS | Status: DC | PRN
  Administered 2019-05-20: 500 mg via INTRAVENOUS
  Filled 2019-05-20: qty 50

## 2019-05-20 MED ORDER — METOCLOPRAMIDE HCL 5 MG PO TABS: 5.0000 mg | ORAL_TABLET | Freq: Three times a day (TID) | ORAL | Status: DC | PRN

## 2019-05-20 MED ORDER — BUPIVACAINE HCL (PF) 0.25 % IJ SOLN
INTRAMUSCULAR | Status: AC
  Filled 2019-05-20: qty 30

## 2019-05-20 MED ORDER — DOCUSATE SODIUM 100 MG PO CAPS
100.0000 mg | ORAL_CAPSULE | Freq: Two times a day (BID) | ORAL | Status: DC
  Administered 2019-05-20 – 2019-05-21 (×2): 100 mg via ORAL
  Filled 2019-05-20 (×2): qty 1

## 2019-05-20 MED ORDER — PROPOFOL 10 MG/ML IV BOLUS
INTRAVENOUS | Status: AC
  Filled 2019-05-20: qty 60

## 2019-05-20 MED ORDER — CHLORHEXIDINE GLUCONATE 4 % EX LIQD: 60.0000 mL | Freq: Once | CUTANEOUS | Status: DC

## 2019-05-20 MED ORDER — DIPHENHYDRAMINE HCL 12.5 MG/5ML PO ELIX: 12.5000 mg | ORAL_SOLUTION | ORAL | Status: DC | PRN

## 2019-05-20 MED ORDER — OXYCODONE HCL 5 MG PO TABS: 5.0000 mg | ORAL_TABLET | Freq: Once | ORAL | Status: DC | PRN

## 2019-05-20 MED ORDER — POLYETHYLENE GLYCOL 3350 17 G PO PACK
17.0000 g | PACK | Freq: Every day | ORAL | Status: DC | PRN
  Administered 2019-05-21: 10:00:00 17 g via ORAL

## 2019-05-20 MED ORDER — DEXAMETHASONE SODIUM PHOSPHATE 10 MG/ML IJ SOLN
8.0000 mg | Freq: Once | INTRAMUSCULAR | Status: AC
  Administered 2019-05-20: 8 mg via INTRAVENOUS

## 2019-05-20 MED ORDER — BUPIVACAINE HCL (PF) 0.75 % IJ SOLN
INTRAMUSCULAR | Status: DC | PRN
  Administered 2019-05-20: 1.8 mL via INTRATHECAL

## 2019-05-20 MED ORDER — ACETAMINOPHEN 500 MG PO TABS
500.0000 mg | ORAL_TABLET | Freq: Four times a day (QID) | ORAL | Status: AC
  Administered 2019-05-20: 500 mg via ORAL
  Filled 2019-05-20: qty 1

## 2019-05-20 MED ORDER — ACETAMINOPHEN 10 MG/ML IV SOLN
1000.0000 mg | Freq: Four times a day (QID) | INTRAVENOUS | Status: DC
  Administered 2019-05-20: 13:00:00 1000 mg via INTRAVENOUS
  Filled 2019-05-20: qty 100

## 2019-05-20 MED ORDER — LACTATED RINGERS IV SOLN
INTRAVENOUS | Status: DC
  Administered 2019-05-20 (×3): via INTRAVENOUS

## 2019-05-20 MED ORDER — WATER FOR IRRIGATION, STERILE IR SOLN
Status: DC | PRN
  Administered 2019-05-20: 2000 mL

## 2019-05-20 MED ORDER — PROMETHAZINE HCL 25 MG/ML IJ SOLN: 6.2500 mg | INTRAMUSCULAR | Status: DC | PRN

## 2019-05-20 MED ORDER — 0.9 % SODIUM CHLORIDE (POUR BTL) OPTIME
TOPICAL | Status: DC | PRN
  Administered 2019-05-20: 14:00:00 1000 mL

## 2019-05-20 MED ORDER — CEFAZOLIN SODIUM-DEXTROSE 1-4 GM/50ML-% IV SOLN
1.0000 g | Freq: Four times a day (QID) | INTRAVENOUS | Status: AC
  Administered 2019-05-20 – 2019-05-21 (×2): 1 g via INTRAVENOUS
  Filled 2019-05-20 (×2): qty 50

## 2019-05-20 MED ORDER — ONDANSETRON HCL 4 MG/2ML IJ SOLN: 4.0000 mg | Freq: Four times a day (QID) | INTRAMUSCULAR | Status: DC | PRN

## 2019-05-20 MED ORDER — HYDROMORPHONE HCL 1 MG/ML IJ SOLN
0.2500 mg | INTRAMUSCULAR | Status: DC | PRN
  Administered 2019-05-20: 15:00:00 0.5 mg via INTRAVENOUS

## 2019-05-20 MED ORDER — BISACODYL 10 MG RE SUPP: 10.0000 mg | Freq: Every day | RECTAL | Status: DC | PRN

## 2019-05-20 MED ORDER — SODIUM CHLORIDE 0.9 % IV SOLN
INTRAVENOUS | Status: DC
  Administered 2019-05-20: 17:00:00 via INTRAVENOUS

## 2019-05-20 MED ORDER — PHENOL 1.4 % MT LIQD
1.0000 | OROMUCOSAL | Status: DC | PRN
  Filled 2019-05-20: qty 177

## 2019-05-20 MED ORDER — FLEET ENEMA 7-19 GM/118ML RE ENEM: 1.0000 | ENEMA | Freq: Once | RECTAL | Status: DC | PRN

## 2019-05-20 MED ORDER — ALPRAZOLAM 0.5 MG PO TABS
0.5000 mg | ORAL_TABLET | Freq: Two times a day (BID) | ORAL | Status: DC | PRN
  Administered 2019-05-20 – 2019-05-21 (×2): 0.5 mg via ORAL
  Filled 2019-05-20 (×2): qty 1

## 2019-05-20 MED ORDER — FENTANYL CITRATE (PF) 100 MCG/2ML IJ SOLN
INTRAMUSCULAR | Status: DC | PRN
  Administered 2019-05-20: 100 ug via INTRAVENOUS

## 2019-05-20 MED ORDER — HYDROCODONE-ACETAMINOPHEN 7.5-325 MG PO TABS
1.0000 | ORAL_TABLET | ORAL | Status: DC | PRN
  Administered 2019-05-20: 1 via ORAL
  Administered 2019-05-20: 2 via ORAL
  Administered 2019-05-20: 1 via ORAL
  Administered 2019-05-21 (×4): 2 via ORAL
  Filled 2019-05-20: qty 1
  Filled 2019-05-20 (×5): qty 2
  Filled 2019-05-20: qty 1

## 2019-05-20 MED ORDER — PROPOFOL 10 MG/ML IV BOLUS
INTRAVENOUS | Status: AC
  Filled 2019-05-20: qty 20

## 2019-05-20 MED ORDER — PHENYLEPHRINE 40 MCG/ML (10ML) SYRINGE FOR IV PUSH (FOR BLOOD PRESSURE SUPPORT)
PREFILLED_SYRINGE | INTRAVENOUS | Status: AC
  Filled 2019-05-20: qty 10

## 2019-05-20 MED ORDER — CEFAZOLIN SODIUM-DEXTROSE 2-4 GM/100ML-% IV SOLN
2.0000 g | INTRAVENOUS | Status: AC
  Administered 2019-05-20: 13:00:00 2 g via INTRAVENOUS
  Filled 2019-05-20: qty 100

## 2019-05-20 MED ORDER — MIDAZOLAM HCL 2 MG/2ML IJ SOLN
INTRAMUSCULAR | Status: AC
  Filled 2019-05-20: qty 2

## 2019-05-20 MED ORDER — PAROXETINE HCL 20 MG PO TABS
40.0000 mg | ORAL_TABLET | Freq: Every day | ORAL | Status: DC
  Administered 2019-05-21: 40 mg via ORAL
  Filled 2019-05-20: qty 2

## 2019-05-20 MED ORDER — MIDAZOLAM HCL 5 MG/5ML IJ SOLN
INTRAMUSCULAR | Status: DC | PRN
  Administered 2019-05-20: 2 mg via INTRAVENOUS

## 2019-05-20 MED ORDER — METOCLOPRAMIDE HCL 5 MG/ML IJ SOLN: 5.0000 mg | Freq: Three times a day (TID) | INTRAMUSCULAR | Status: DC | PRN

## 2019-05-20 MED ORDER — DEXAMETHASONE SODIUM PHOSPHATE 10 MG/ML IJ SOLN
10.0000 mg | Freq: Once | INTRAMUSCULAR | Status: AC
  Administered 2019-05-21: 10 mg via INTRAVENOUS
  Filled 2019-05-20: qty 1

## 2019-05-20 MED ORDER — PROPOFOL 500 MG/50ML IV EMUL
INTRAVENOUS | Status: DC | PRN
  Administered 2019-05-20: 75 ug/kg/min via INTRAVENOUS

## 2019-05-20 MED ORDER — ASPIRIN EC 325 MG PO TBEC
325.0000 mg | DELAYED_RELEASE_TABLET | Freq: Two times a day (BID) | ORAL | Status: DC
  Administered 2019-05-21: 325 mg via ORAL
  Filled 2019-05-20: qty 1

## 2019-05-20 MED ORDER — PROPOFOL 500 MG/50ML IV EMUL
INTRAVENOUS | Status: DC | PRN
  Administered 2019-05-20 (×2): 30 mg via INTRAVENOUS

## 2019-05-20 MED ORDER — POLYETHYLENE GLYCOL 3350 17 G PO PACK
17.0000 g | PACK | Freq: Every day | ORAL | Status: DC
  Administered 2019-05-21: 17 g via ORAL
  Filled 2019-05-20: qty 1

## 2019-05-20 MED ORDER — AMPHETAMINE-DEXTROAMPHETAMINE 30 MG PO TABS: 30.0000 mg | ORAL_TABLET | Freq: Three times a day (TID) | ORAL | Status: DC

## 2019-05-20 MED ORDER — METHOCARBAMOL 500 MG PO TABS
500.0000 mg | ORAL_TABLET | Freq: Four times a day (QID) | ORAL | Status: DC | PRN
  Administered 2019-05-20 – 2019-05-21 (×4): 500 mg via ORAL
  Filled 2019-05-20 (×4): qty 1

## 2019-05-20 MED ORDER — FENTANYL CITRATE (PF) 100 MCG/2ML IJ SOLN
INTRAMUSCULAR | Status: AC
  Filled 2019-05-20: qty 2

## 2019-05-20 MED ORDER — MORPHINE SULFATE (PF) 2 MG/ML IV SOLN
0.5000 mg | INTRAVENOUS | Status: DC | PRN
  Administered 2019-05-20 – 2019-05-21 (×3): 1 mg via INTRAVENOUS
  Filled 2019-05-20 (×3): qty 1

## 2019-05-20 MED ORDER — ONDANSETRON HCL 4 MG/2ML IJ SOLN
INTRAMUSCULAR | Status: DC | PRN
  Administered 2019-05-20: 4 mg via INTRAVENOUS

## 2019-05-20 MED ORDER — OXYCODONE HCL 5 MG/5ML PO SOLN: 5.0000 mg | Freq: Once | ORAL | Status: DC | PRN

## 2019-05-20 SURGICAL SUPPLY — 47 items
BAG DECANTER FOR FLEXI CONT (MISCELLANEOUS) IMPLANT
BAG ZIPLOCK 12X15 (MISCELLANEOUS) ×3 IMPLANT
BALL HIP CERAMIC (Hips) ×1 IMPLANT
BLADE SAG 18X100X1.27 (BLADE) ×3 IMPLANT
CLOSURE WOUND 1/2 X4 (GAUZE/BANDAGES/DRESSINGS) ×2
COVER PERINEAL POST (MISCELLANEOUS) ×3 IMPLANT
COVER SURGICAL LIGHT HANDLE (MISCELLANEOUS) ×3 IMPLANT
COVER WAND RF STERILE (DRAPES) IMPLANT
CUP ACET PINNACLE SECTR 50MM (Hips) ×1 IMPLANT
DECANTER SPIKE VIAL GLASS SM (MISCELLANEOUS) ×3 IMPLANT
DRAPE STERI IOBAN 125X83 (DRAPES) ×3 IMPLANT
DRAPE U-SHAPE 47X51 STRL (DRAPES) ×6 IMPLANT
DRSG ADAPTIC 3X8 NADH LF (GAUZE/BANDAGES/DRESSINGS) ×3 IMPLANT
DRSG MEPILEX BORDER 4X4 (GAUZE/BANDAGES/DRESSINGS) ×3 IMPLANT
DRSG MEPILEX BORDER 4X8 (GAUZE/BANDAGES/DRESSINGS) ×3 IMPLANT
DURAPREP 26ML APPLICATOR (WOUND CARE) ×3 IMPLANT
ELECT REM PT RETURN 15FT ADLT (MISCELLANEOUS) ×3 IMPLANT
EVACUATOR 1/8 PVC DRAIN (DRAIN) ×3 IMPLANT
GLOVE BIO SURGEON STRL SZ 6 (GLOVE) ×3 IMPLANT
GLOVE BIO SURGEON STRL SZ7 (GLOVE) ×3 IMPLANT
GLOVE BIO SURGEON STRL SZ8 (GLOVE) ×3 IMPLANT
GLOVE BIOGEL PI IND STRL 6.5 (GLOVE) ×1 IMPLANT
GLOVE BIOGEL PI IND STRL 7.0 (GLOVE) ×1 IMPLANT
GLOVE BIOGEL PI IND STRL 8 (GLOVE) ×1 IMPLANT
GLOVE BIOGEL PI INDICATOR 6.5 (GLOVE) ×2
GLOVE BIOGEL PI INDICATOR 7.0 (GLOVE) ×2
GLOVE BIOGEL PI INDICATOR 8 (GLOVE) ×2
GOWN STRL REUS W/TWL LRG LVL3 (GOWN DISPOSABLE) ×3 IMPLANT
GOWN STRL REUS W/TWL XL LVL3 (GOWN DISPOSABLE) ×3 IMPLANT
HIP BALL CERAMIC (Hips) ×3 IMPLANT
HOLDER FOLEY CATH W/STRAP (MISCELLANEOUS) ×3 IMPLANT
KIT TURNOVER KIT A (KITS) IMPLANT
LINER MARATHON 32 50 (Hips) ×3 IMPLANT
MANIFOLD NEPTUNE II (INSTRUMENTS) ×3 IMPLANT
PACK ANTERIOR HIP CUSTOM (KITS) ×3 IMPLANT
PINNACLE SECTOR CUP 50MM (Hips) ×3 IMPLANT
STEM FEM SZ3 STD ACTIS (Stem) ×3 IMPLANT
STRIP CLOSURE SKIN 1/2X4 (GAUZE/BANDAGES/DRESSINGS) ×4 IMPLANT
SUT ETHIBOND NAB CT1 #1 30IN (SUTURE) ×3 IMPLANT
SUT MNCRL AB 4-0 PS2 18 (SUTURE) ×3 IMPLANT
SUT STRATAFIX 0 PDS 27 VIOLET (SUTURE) ×3
SUT VIC AB 2-0 CT1 27 (SUTURE) ×4
SUT VIC AB 2-0 CT1 TAPERPNT 27 (SUTURE) ×2 IMPLANT
SUTURE STRATFX 0 PDS 27 VIOLET (SUTURE) ×1 IMPLANT
SYR 50ML LL SCALE MARK (SYRINGE) IMPLANT
TRAY FOLEY MTR SLVR 16FR STAT (SET/KITS/TRAYS/PACK) ×3 IMPLANT
YANKAUER SUCT BULB TIP 10FT TU (MISCELLANEOUS) ×3 IMPLANT

## 2019-05-20 NOTE — Anesthesia Postprocedure Evaluation (Signed)
Anesthesia Post Note  Patient: KATHLEENE BERGEMANN  Procedure(s) Performed: TOTAL HIP ARTHROPLASTY ANTERIOR APPROACH (Right Hip)     Patient location during evaluation: PACU Anesthesia Type: Spinal Level of consciousness: oriented and awake and alert Pain management: pain level controlled Vital Signs Assessment: post-procedure vital signs reviewed and stable Respiratory status: spontaneous breathing and respiratory function stable Cardiovascular status: blood pressure returned to baseline and stable Postop Assessment: no headache, no backache and no apparent nausea or vomiting Anesthetic complications: no    Last Vitals:  Vitals:   05/20/19 1515 05/20/19 1538  BP: 106/73 110/76  Pulse: 62 69  Resp: 11 16  Temp: (!) 36.4 C 36.7 C  SpO2: 100% 100%    Last Pain:  Vitals:   05/20/19 1515  TempSrc:   PainSc: Asleep                 Lynda Rainwater

## 2019-05-20 NOTE — Transfer of Care (Signed)
Immediate Anesthesia Transfer of Care Note  Patient: Marissa Barnes  Procedure(s) Performed: TOTAL HIP ARTHROPLASTY ANTERIOR APPROACH (Right Hip)  Patient Location: PACU  Anesthesia Type:Spinal  Level of Consciousness: unresponsive, patient cooperative and responds to stimulation  Airway & Oxygen Therapy: Patient Spontanous Breathing and Patient connected to face mask oxygen  Post-op Assessment: Report given to RN and Post -op Vital signs reviewed and stable  Post vital signs: Reviewed and stable  Last Vitals:  Vitals Value Taken Time  BP 110/71 05/20/19 1427  Temp    Pulse 73 05/20/19 1429  Resp 14 05/20/19 1429  SpO2 100 % 05/20/19 1429  Vitals shown include unvalidated device data.  Last Pain:  Vitals:   05/20/19 1155  TempSrc:   PainSc: 7       Patients Stated Pain Goal: 4 (16/07/37 1062)  Complications: No apparent anesthesia complications

## 2019-05-20 NOTE — Discharge Instructions (Signed)
°Dr. Frank Aluisio °Total Joint Specialist °[facility] °[address] °[place], [place] [address] °[phone] ° °ANTERIOR APPROACH TOTAL HIP REPLACEMENT POSTOPERATIVE DIRECTIONS ° ° °Hip Rehabilitation, Guidelines Following Surgery  °The results of a hip operation are greatly improved after range of motion and muscle strengthening exercises. Follow all safety measures which are given to protect your hip. If any of these exercises cause increased pain or swelling in your joint, decrease the amount until you are comfortable again. Then slowly increase the exercises. Call your caregiver if you have problems or questions.  ° °HOME CARE INSTRUCTIONS  °• Remove items at home which could result in a fall. This includes throw rugs or furniture in walking pathways.  °· ICE to the affected hip every three hours for 30 minutes at a time and then as needed for pain and swelling.  Continue to use ice on the hip for pain and swelling from surgery. You may notice swelling that will progress down to the foot and ankle.  This is normal after surgery.  Elevate the leg when you are not up walking on it.   °· Continue to use the breathing machine which will help keep your temperature down.  It is common for your temperature to cycle up and down following surgery, especially at night when you are not up moving around and exerting yourself.  The breathing machine keeps your lungs expanded and your temperature down. ° °DIET °You may resume your previous home diet once your are discharged from the hospital. ° °DRESSING / WOUND CARE / SHOWERING °You may shower 3 days after surgery, but keep the wounds dry during showering.  You may use an occlusive plastic wrap (Press'n Seal for example), NO SOAKING/SUBMERGING IN THE BATHTUB.  If the bandage gets wet, change with a clean dry gauze.  If the incision gets wet, pat the wound dry with a clean towel. °You may start showering once you are discharged home but do not submerge the  incision under water. Just pat the incision dry and apply a dry gauze dressing on daily. °Change the surgical dressing daily and reapply a dry dressing each time. ° °ACTIVITY °Walk with your walker as instructed. °Use walker as long as suggested by your caregivers. °Avoid periods of inactivity such as sitting longer than an hour when not asleep. This helps prevent blood clots.  °You may resume a sexual relationship in one month or when given the OK by your doctor.  °You may return to work once you are cleared by your doctor.  °Do not drive a car for 6 weeks or until released by you surgeon.  °Do not drive while taking narcotics. ° °WEIGHT BEARING °Weight bearing as tolerated with assist device (walker, cane, etc) as directed, use it as long as suggested by your surgeon or therapist, typically at least 4-6 weeks. ° °POSTOPERATIVE CONSTIPATION PROTOCOL °Constipation - defined medically as fewer than three stools per week and severe constipation as less than one stool per week. ° °One of the most common issues patients have following surgery is constipation.  Even if you have a regular bowel pattern at home, your normal regimen is likely to be disrupted due to multiple reasons following surgery.  Combination of anesthesia, postoperative narcotics, change in appetite and fluid intake all can affect your bowels.  In order to avoid complications following surgery, here are some recommendations in order to help you during your recovery period. ° °Colace (docusate) - Pick up an over-the-counter form   of Colace or another stool softener and take twice a day as long as you are requiring postoperative pain medications.  Take with a full glass of water daily.  If you experience loose stools or diarrhea, hold the colace until you stool forms back up.  If your symptoms do not get better within 1 week or if they get worse, check with your doctor. ° °Dulcolax (bisacodyl) - Pick up over-the-counter and take as directed by the product  packaging as needed to assist with the movement of your bowels.  Take with a full glass of water.  Use this product as needed if not relieved by Colace only.  ° °MiraLax (polyethylene glycol) - Pick up over-the-counter to have on hand.  MiraLax is a solution that will increase the amount of water in your bowels to assist with bowel movements.  Take as directed and can mix with a glass of water, juice, soda, coffee, or tea.  Take if you go more than two days without a movement. °Do not use MiraLax more than once per day. Call your doctor if you are still constipated or irregular after using this medication for 7 days in a row. ° °If you continue to have problems with postoperative constipation, please contact the office for further assistance and recommendations.  If you experience "the worst abdominal pain ever" or develop nausea or vomiting, please contact the office immediatly for further recommendations for treatment. ° °ITCHING ° If you experience itching with your medications, try taking only a single pain pill, or even half a pain pill at a time.  You can also use Benadryl over the counter for itching or also to help with sleep.  ° °TED HOSE STOCKINGS °Wear the elastic stockings on both legs for three weeks following surgery during the day but you may remove then at night for sleeping. ° °MEDICATIONS °See your medication summary on the “After Visit Summary” that the nursing staff will review with you prior to discharge.  You may have some home medications which will be placed on hold until you complete the course of blood thinner medication.  It is important for you to complete the blood thinner medication as prescribed by your surgeon.  Continue your approved medications as instructed at time of discharge. ° °PRECAUTIONS °If you experience chest pain or shortness of breath - call 911 immediately for transfer to the hospital emergency department.  °If you develop a fever greater that 101 F, purulent drainage  from wound, increased redness or drainage from wound, foul odor from the wound/dressing, or calf pain - CONTACT YOUR SURGEON.   °                                                °FOLLOW-UP APPOINTMENTS °Make sure you keep all of your appointments after your operation with your surgeon and caregivers. You should call the office at the above phone number and make an appointment for approximately two weeks after the date of your surgery or on the date instructed by your surgeon outlined in the "After Visit Summary". ° °RANGE OF MOTION AND STRENGTHENING EXERCISES  °These exercises are designed to help you keep full movement of your hip joint. Follow your caregiver's or physical therapist's instructions. Perform all exercises about fifteen times, three times per day or as directed. Exercise both hips, even if you have   had only one joint replacement. These exercises can be done on a training (exercise) mat, on the floor, on a table or on a bed. Use whatever works the best and is most comfortable for you. Use music or television while you are exercising so that the exercises are a pleasant break in your day. This will make your life better with the exercises acting as a break in routine you can look forward to.  °• Lying on your back, slowly slide your foot toward your buttocks, raising your knee up off the floor. Then slowly slide your foot back down until your leg is straight again.  °• Lying on your back spread your legs as far apart as you can without causing discomfort.  °• Lying on your side, raise your upper leg and foot straight up from the floor as far as is comfortable. Slowly lower the leg and repeat.  °• Lying on your back, tighten up the muscle in the front of your thigh (quadriceps muscles). You can do this by keeping your leg straight and trying to raise your heel off the floor. This helps strengthen the largest muscle supporting your knee.  °• Lying on your back, tighten up the muscles of your buttocks both  with the legs straight and with the knee bent at a comfortable angle while keeping your heel on the floor.  ° °IF YOU ARE TRANSFERRED TO A SKILLED REHAB FACILITY °If the patient is transferred to a skilled rehab facility following release from the hospital, a list of the current medications will be sent to the facility for the patient to continue.  When discharged from the skilled rehab facility, please have the facility set up the patient's Home Health Physical Therapy prior to being released. Also, the skilled facility will be responsible for providing the patient with their medications at time of release from the facility to include their pain medication, the muscle relaxants, and their blood thinner medication. If the patient is still at the rehab facility at time of the two week follow up appointment, the skilled rehab facility will also need to assist the patient in arranging follow up appointment in our office and any transportation needs. ° °MAKE SURE YOU:  °• Understand these instructions.  °• Get help right away if you are not doing well or get worse.  ° ° °Pick up stool softner and laxative for home use following surgery while on pain medications. °Do not submerge incision under water. °Please use good hand washing techniques while changing dressing each day. °May shower starting three days after surgery. °Please use a clean towel to pat the incision dry following showers. °Continue to use ice for pain and swelling after surgery. °Do not use any lotions or creams on the incision until instructed by your surgeon. ° °

## 2019-05-20 NOTE — Evaluation (Signed)
Physical Therapy Evaluation Patient Details Name: Marissa Barnes MRN: [id number] DOB: 10/26/1965 Today's Date: 05/20/2019   History of Present Illness  Patient is 53 y.o. female s/p Rt THA ant appraoch on 05/20/19 with PMH significant for anxiety and depression.  Clinical Impression  LAKETIA VICKNAIR is a 53 y.o. female POD 0 s/p Rt THA ant approach. Patient reports independence with mobility at baseline. Patient is now limited by functional impairments (see PT problem list below) and requires min assist for transfers and gait with RW. Patient was limited today due to pain in Rt hip and ambulation was not attempted secondary to pain. Patient had complicated recovery due to Rt hip dislocation in PACU while her spinal tap was still in effect. Patient transfered with min assist to bedside recliner with RW. Patient instructed in exercise to facilitate circulation and ice applied to hip. RN providing pain meds at EOS. Patient will benefit from continued skilled PT interventions to address impairments and progress towards PLOF. Acute PT will follow to progress mobility and stair training in preparation for safe discharge home.     Follow Up Recommendations Follow surgeon's recommendation for DC plan and follow-up therapies    Equipment Recommendations  Rolling walker with 5" wheels    Recommendations for Other Services       Precautions / Restrictions Precautions Precautions: Fall Restrictions Weight Bearing Restrictions: No      Mobility  Bed Mobility Overal bed mobility: Needs Assistance Bed Mobility: Supine to Sit     Supine to sit: HOB elevated;Min assist     General bed mobility comments: assist for LE mobility secondary to Rt hip pain, pt using bed rails to sit upright and scoot to EOB  Transfers Overall transfer level: Needs assistance Equipment used: Rolling walker (2 wheeled) Transfers: Sit to/from [employer] Sit to Stand: Min assist Stand pivot  transfers: Min assist       General transfer comment: verbal cues for hand placement and technique, min assist to initiate power up and to steady during rise to full standing, pt hesitant to place weight on Rt LE. ceus for sequencing steps with RW to transfer to bedside recliner  Ambulation/Gait             General Gait Details: NT secondary to pain  Stairs            Wheelchair Mobility    Modified Rankin (Stroke Patients Only)       Balance Overall balance assessment: Needs assistance Sitting-balance support: No upper extremity supported;Feet supported Sitting balance-Leahy Scale: Good     Standing balance support: During functional activity;Bilateral upper extremity supported Standing balance-Leahy Scale: Poor Standing balance comment: patient reliant on UE support              Pertinent Vitals/Pain Pain Assessment: 0-10 Pain Score: 7  Pain Location: Rt hip Pain Descriptors / Indicators: Aching;Burning Pain Intervention(s): Limited activity within patient's tolerance;Monitored during session;Ice applied;RN gave pain meds during session    Home Living Family/patient expects to be discharged to:: Private residence Living Arrangements: Children(14 and 51 y.o. daughters) Available Help at Discharge: Family Type of Home: House Home Access: Stairs to enter Entrance Stairs-Rails: None Entrance Stairs-Number of Steps: 2-3, no rails Home Layout: Two level;Bed/bath upstairs Home Equipment: Crutches      Prior Function Level of Independence: Independent         Comments: pt was independent with all mobility prior to surgery, she works full time at her [employer]        Extremity/Trunk Assessment   Upper Extremity Assessment Upper Extremity Assessment: Overall WFL for tasks assessed    Lower Extremity Assessment Lower Extremity Assessment: RLE deficits/detail RLE: Unable to fully assess due to pain    Cervical /  Trunk Assessment Cervical / Trunk Assessment: Normal  Communication   Communication: No difficulties  Cognition Arousal/Alertness: Awake/alert Behavior During Therapy: WFL for tasks assessed/performed Overall Cognitive Status: Within Functional Limits for tasks assessed             General Comments      Exercises Total Joint Exercises Ankle Circles/Pumps: AROM;10 reps;Seated;Both   Assessment/Plan    PT Assessment Patient needs continued PT services  PT Problem List Decreased strength;Decreased balance;Pain;Decreased range of motion;Decreased mobility;Decreased activity tolerance;Decreased knowledge of use of DME       PT Treatment Interventions DME instruction;Functional mobility training;Balance training;Patient/family education;Modalities;Gait training;Therapeutic activities;Therapeutic exercise;Stair training    PT Goals (Current goals can be found in the Care Plan section)  Acute Rehab PT Goals Patient Stated Goal: to be able to walk without pain and return home PT Goal Formulation: With patient Time For Goal Achievement: 05/27/19 Potential to Achieve Goals: Good    Frequency 7X/week    AM-PAC PT "6 Clicks" Mobility  Outcome Measure Help needed turning from your back to your side while in a flat bed without using bedrails?: A Little Help needed moving from lying on your back to sitting on the side of a flat bed without using bedrails?: A Little Help needed moving to and from a bed to a chair (including a wheelchair)?: A Little Help needed standing up from a chair using your arms (e.g., wheelchair or bedside chair)?: A Little Help needed to walk in hospital room?: A Lot Help needed climbing 3-5 steps with a railing? : A Lot 6 Click Score: 16    End of Session Equipment Utilized During Treatment: Gait belt Activity Tolerance: Patient limited by pain Patient left: in chair;with chair alarm set;with call bell/phone within reach;with nursing/sitter in room Nurse  Communication: Mobility status PT Visit Diagnosis: Unsteadiness on feet (R26.81);Other abnormalities of gait and mobility (R26.89);Muscle weakness (generalized) (M62.81);Difficulty in walking, not elsewhere classified (R26.2);Pain Pain - Right/Left: Right Pain - part of body: Hip    Time: 1840-1902 PT Time Calculation (min) (ACUTE ONLY): 22 min   Charges:   PT Evaluation $PT Eval Low Complexity: 1 Low          Kipp Brood, PT, DPT, [facility] Physical Therapist with [facility]  05/20/2019 7:18 PM

## 2019-05-20 NOTE — Plan of Care (Signed)
Continue current POC 

## 2019-05-20 NOTE — Op Note (Signed)
OPERATIVE REPORT- TOTAL HIP ARTHROPLASTY   PREOPERATIVE DIAGNOSIS: Osteoarthritis of the Right hip.   POSTOPERATIVE DIAGNOSIS: Osteoarthritis of the Right  hip.   PROCEDURE: Right total hip arthroplasty, anterior approach.   SURGEON: Gaynelle Arabian, MD   ASSISTANT: Molli Barrows, PA-C  ANESTHESIA:  Spinal  ESTIMATED BLOOD LOSS:-400 mL    DRAINS: Hemovac x1.   COMPLICATIONS: None   CONDITION: PACU - hemodynamically stable.   BRIEF CLINICAL NOTE: Marissa Barnes is a 53 y.o. female who has advanced end-  stage arthritis of their Right  hip with progressively worsening pain and  dysfunction.The patient has failed nonoperative management and presents for  total hip arthroplasty.   PROCEDURE IN DETAIL: After successful administration of spinal  anesthetic, the traction boots for the [facility] bed were placed on both  feet and the patient was placed onto the [facility] bed, boots placed into the leg  holders. The Right hip was then isolated from the perineum with plastic  drapes and prepped and draped in the usual sterile fashion. ASIS and  greater trochanter were marked and a oblique incision was made, starting  at about 1 cm lateral and 2 cm distal to the ASIS and coursing towards  the anterior cortex of the femur. The skin was cut with a 10 blade  through subcutaneous tissue to the level of the fascia overlying the  tensor fascia lata muscle. The fascia was then incised in line with the  incision at the junction of the anterior third and posterior 2/3rd. The  muscle was teased off the fascia and then the interval between the TFL  and the rectus was developed. The Hohmann retractor was then placed at  the top of the femoral neck over the capsule. The vessels overlying the  capsule were cauterized and the fat on top of the capsule was removed.  A Hohmann retractor was then placed anterior underneath the rectus  femoris to give exposure to the entire anterior capsule. A T-shaped   capsulotomy was performed. The edges were tagged and the femoral head  was identified.       Osteophytes are removed off the superior acetabulum.  The femoral neck was then cut in situ with an oscillating saw. Traction  was then applied to the left lower extremity utilizing the [facility]  traction. The femoral head was then removed. Retractors were placed  around the acetabulum and then circumferential removal of the labrum was  performed. Osteophytes were also removed. Reaming starts at 47 mm to  medialize and  Increased in 2 mm increments to 49 mm. We reamed in  approximately 40 degrees of abduction, 20 degrees anteversion. A 50 mm  pinnacle acetabular shell was then impacted in anatomic position under  fluoroscopic guidance with excellent purchase. We did not need to place  any additional dome screws. A 32 mm neural + 4 marathon liner was then  placed into the acetabular shell.       The femoral lift was then placed along the lateral aspect of the femur  just distal to the vastus ridge. The leg was  externally rotated and capsule  was stripped off the inferior aspect of the femoral neck down to the  level of the lesser trochanter, this was done with electrocautery. The femur was lifted after this was performed. The  leg was then placed in an extended and adducted position essentially delivering the femur. We also removed the capsule superiorly and the piriformis from the piriformis  fossa to gain excellent exposure of the  proximal femur. Rongeur was used to remove some cancellous bone to get  into the lateral portion of the proximal femur for placement of the  initial starter reamer. The starter broaches was placed  the starter broach  and was shown to go down the center of the canal. Broaching  with the Actis system was then performed starting at size 0  coursing  Up to size 4. A size 4 had excellent torsional and rotational  and axial stability. The trial standard offset neck was then  placed  with a 32 + 5 trial head. The hip was then reduced. We confirmed that  the stem was in the canal both on AP and lateral x-rays. It also has excellent sizing. The hip was reduced with outstanding stability through full extension and full external rotation.. AP pelvis was taken and the leg lengths were measured and found to be equal. Hip was then dislocated again and the femoral head and neck removed. The  femoral broach was removed. Size 4 Actis stem with a standard offset  neck was then impacted into the femur following native anteversion. Has  excellent purchase in the canal. Excellent torsional and rotational and  axial stability. It is confirmed to be in the canal on AP and lateral  fluoroscopic views. The 32 + 5 ceramic head was placed and the hip  reduced with outstanding stability. Again AP pelvis was taken and it  confirmed that the leg lengths were equal. The wound was then copiously  irrigated with saline solution and the capsule reattached and repaired  with Ethibond suture. 30 ml of .25% Bupivicaine was  injected into the capsule and into the edge of the tensor fascia lata as well as subcutaneous tissue. The fascia overlying the tensor fascia lata was then closed with a running #1 V-Loc. Subcu was closed with interrupted 2-0 Vicryl and subcuticular running 4-0 Monocryl. Incision was cleaned  and dried. Steri-Strips and a bulky sterile dressing applied. Hemovac  drain was hooked to suction and then the patient was awakened and transported to  recovery in stable condition.        Please note that a surgical assistant was a medical necessity for this procedure to perform it in a safe and expeditious manner. Assistant was necessary to provide appropriate retraction of vital neurovascular structures and to prevent femoral fracture and allow for anatomic placement of the prosthesis.  Gaynelle Arabian, M.D.

## 2019-05-20 NOTE — Interval H&P Note (Signed)
History and Physical Interval Note:  05/20/2019 11:55 AM  Signe Colt  has presented today for surgery, with the diagnosis of right hip osteoarthritis.  The various methods of treatment have been discussed with the patient and family. After consideration of risks, benefits and other options for treatment, the patient has consented to  Procedure(s) with comments: [place] (Right) - 143min as a surgical intervention.  The patient's history has been reviewed, patient examined, no change in status, stable for surgery.  I have reviewed the patient's chart and labs.  Questions were answered to the patient's satisfaction.     Pilar Plate Delmo Matty

## 2019-05-20 NOTE — Anesthesia Procedure Notes (Signed)
Spinal  Start time: 05/20/2019 12:55 PM End time: 05/20/2019 12:59 PM Staffing Resident/CRNA: Gean Maidens, CRNA Performed: resident/CRNA  Preanesthetic Checklist Completed: patient identified, site marked, surgical consent, pre-op evaluation, timeout performed, IV checked, risks and benefits discussed and monitors and equipment checked Spinal Block Patient position: sitting Prep: DuraPrep Patient monitoring: heart rate, continuous pulse ox and blood pressure Approach: midline Location: L4-5 Injection technique: single-shot Needle Needle type: Pencan  Needle length: 9 cm Needle insertion depth: 6 cm Additional Notes Pt sitting position, sterile prep and drape, negative paresthesia/heme

## 2019-05-20 NOTE — Progress Notes (Signed)
Was called to PACU earlier after Pos-op X- ray was taken and noted to have anterior dislocation. The patient was "very active" in moving around in her bed and the spinal was still intact. Her right leg was shortened and externally rotated and I was able to apply traction and internal rotation to reduce the hip. It was very stable post reduction and x-ray confirmed concentric reduction. We placed her into a bone foam device to keep neutral rotation on the leg. When I rechecked her sensation and motor were almost fully returned and her hip remains stable.The bone foam will be removed once she is ready to ambulate with PT

## 2019-05-20 NOTE — Anesthesia Preprocedure Evaluation (Signed)
Anesthesia Evaluation  Patient identified by MRN, date of birth, ID band Patient awake    Reviewed: Allergy & Precautions, NPO status , Patient's Chart, lab work & pertinent test results  Airway Mallampati: II  TM Distance: >3 FB Neck ROM: Full    Dental no notable dental hx.    Pulmonary neg pulmonary ROS,    Pulmonary exam normal breath sounds clear to auscultation       Cardiovascular negative cardio ROS Normal cardiovascular exam Rhythm:Regular Rate:Normal     Neuro/Psych Anxiety Depression negative neurological ROS  negative psych ROS   GI/Hepatic negative GI ROS, Neg liver ROS,   Endo/Other  negative endocrine ROS  Renal/GU negative Renal ROS  negative genitourinary   Musculoskeletal  (+) Arthritis , Osteoarthritis,    Abdominal   Peds negative pediatric ROS (+)  Hematology negative hematology ROS (+)   Anesthesia Other Findings   Reproductive/Obstetrics negative OB ROS                             Anesthesia Physical Anesthesia Plan  ASA: II  Anesthesia Plan: Spinal   Post-op Pain Management:    Induction: Intravenous  PONV Risk Score and Plan: 2 and Ondansetron, Midazolam and Treatment may vary due to age or medical condition  Airway Management Planned: Simple Face Mask  Additional Equipment:   Intra-op Plan:   Post-operative Plan:   Informed Consent: I have reviewed the patients History and Physical, chart, labs and discussed the procedure including the risks, benefits and alternatives for the proposed anesthesia with the patient or authorized representative who has indicated his/her understanding and acceptance.     Dental advisory given  Plan Discussed with: CRNA  Anesthesia Plan Comments:         Anesthesia Quick Evaluation

## 2019-05-21 ENCOUNTER — Encounter (HOSPITAL_COMMUNITY): Payer: Self-pay | Admitting: Orthopedic Surgery

## 2019-05-21 ENCOUNTER — Observation Stay (HOSPITAL_COMMUNITY): Payer: Medicaid Other

## 2019-05-21 DIAGNOSIS — M1611 Unilateral primary osteoarthritis, right hip: Secondary | ICD-10-CM | POA: Diagnosis not present

## 2019-05-21 LAB — BASIC METABOLIC PANEL
Anion gap: 7 (ref 5–15)
BUN: 12 mg/dL (ref 6–20)
CO2: 26 mmol/L (ref 22–32)
Calcium: 8.5 mg/dL — ABNORMAL LOW (ref 8.9–10.3)
Chloride: 107 mmol/L (ref 98–111)
Creatinine, Ser: 0.73 mg/dL (ref 0.44–1.00)
GFR calc Af Amer: >60 mL/min (ref >60)
GFR calc non Af Amer: >60 mL/min (ref >60)
Glucose, Bld: 135 mg/dL — ABNORMAL HIGH (ref 70–99)
Potassium: 4.2 mmol/L (ref 3.5–5.1)
Sodium: 140 mmol/L (ref 135–145)

## 2019-05-21 LAB — CBC
HCT: 29.4 % — ABNORMAL LOW (ref 36.0–46.0)
Hemoglobin: 9.7 g/dL — ABNORMAL LOW (ref 12.0–15.0)
MCH: 29 pg (ref 26.0–34.0)
MCHC: 33 g/dL (ref 30.0–36.0)
MCV: 87.8 fL (ref 80.0–100.0)
Platelets: 194 10*3/uL (ref 150–400)
RBC: 3.35 MIL/uL — ABNORMAL LOW (ref 3.87–5.11)
RDW: 13.1 % (ref 11.5–15.5)
WBC: 10.1 10*3/uL (ref 4.0–10.5)
nRBC: 0 % (ref 0.0–0.2)

## 2019-05-21 MED ORDER — HYDROCODONE-ACETAMINOPHEN 7.5-325 MG PO TABS: 1.0000 | ORAL_TABLET | Freq: Four times a day (QID) | ORAL | 0 refills | Status: DC | PRN

## 2019-05-21 MED ORDER — METHOCARBAMOL 500 MG PO TABS: 500.0000 mg | ORAL_TABLET | Freq: Four times a day (QID) | ORAL | 0 refills | Status: DC | PRN

## 2019-05-21 MED ORDER — ASPIRIN 325 MG PO TBEC: 325.0000 mg | DELAYED_RELEASE_TABLET | Freq: Two times a day (BID) | ORAL | 0 refills | Status: AC

## 2019-05-21 NOTE — TOC Transition Note (Addendum)
Transition of Care [facility]) - CM/SW Discharge Note   Patient Details  Name: Marissa Barnes MRN: [id number] Date of Birth: Oct 31, 1965  [facility] CM/SW Contact:  Lia Hopping, [place] Phone Number: 05/21/2019, 3:05 PM   Clinical Narrative:    Therapy:HEP RW ordered and delivered to the patient room.   Patient is now agreeable to a 3 in 1.Ordered through [place] @3 :27pm, will deliver to the patient room    Final next level of care: Home/Self Care Barriers to Discharge: No Barriers Identified   Patient Goals and CMS Choice     Choice offered to / list presented to : NA  Discharge Placement                       Discharge Plan and Services                DME Arranged: Walker rolling DME Agency: [employer] Date DME Agency Contacted: 05/21/19 Time DME Agency Contacted: [phone] Representative spoke with at DME Agency: [place] ([place]) Interventions     Readmission Risk Interventions No flowsheet data found.

## 2019-05-21 NOTE — Plan of Care (Signed)

## 2019-05-21 NOTE — Progress Notes (Signed)
Physical Therapy Treatment Patient Details Name: Marissa Barnes MRN: [id number] DOB: 11/02/1965 Today's Date: 05/21/2019    History of Present Illness Patient is 53 y.o. female s/p Rt THA ant appraoch on 05/20/19 with PMH significant for anxiety and depression.    PT Comments    Pt progressing well with mobility including negotiating stairs.  Car transfers reviewed.   Follow Up Recommendations  Follow surgeon's recommendation for DC plan and follow-up therapies     Equipment Recommendations  Rolling walker with 5" wheels;3in1 (PT)    Recommendations for Other Services       Precautions / Restrictions Precautions Precautions: Fall Restrictions Weight Bearing Restrictions: No Other Position/Activity Restrictions: WBAT    Mobility  Bed Mobility Overal bed mobility: Needs Assistance Bed Mobility: Sit to Supine     Supine to sit: Supervision Sit to supine: Min guard   General bed mobility comments: cues for sequence; pt self assisting with L LE  Transfers Overall transfer level: Needs assistance Equipment used: Rolling walker (2 wheeled) Transfers: Sit to/from Stand Sit to Stand: Min guard;Supervision         General transfer comment: cues for LE management and use of UEs to self assist  Ambulation/Gait Ambulation/Gait assistance: Min guard;Supervision Gait Distance (Feet): 100 Feet Assistive device: Rolling walker (2 wheeled) Gait Pattern/deviations: Step-to pattern;Step-through pattern;Decreased step length - right;Decreased step length - left;Shuffle;Trunk flexed Gait velocity: decr   General Gait Details: cues for sequence, posture and position from RW   Stairs Stairs: Yes Stairs assistance: Min assist Stair Management: No rails;One rail Right;Step to pattern;Forwards;With crutches Number of Stairs: 5 General stair comments: two steps with rail and crutch; 3 steps with bil crutches; cues for sequence and foot/crutch placement; written instruction  provided   Wheelchair Mobility    Modified Rankin (Stroke Patients Only)       Balance Overall balance assessment: Needs assistance Sitting-balance support: No upper extremity supported;Feet supported Sitting balance-Leahy Scale: Good     Standing balance support: During functional activity;Bilateral upper extremity supported Standing balance-Leahy Scale: Fair Standing balance comment: patient reliant on UE support                            Cognition Arousal/Alertness: Awake/alert Behavior During Therapy: WFL for tasks assessed/performed Overall Cognitive Status: Within Functional Limits for tasks assessed                                        Exercises Total Joint Exercises Ankle Circles/Pumps: AROM;Both;15 reps;Supine Quad Sets: AROM;Both;10 reps;Supine Heel Slides: AAROM;Right;Supine;15 reps Hip ABduction/ADduction: AAROM;Right;15 reps;Supine Long Arc Quad: AAROM;AROM;Right;10 reps;Seated    General Comments        Pertinent Vitals/Pain Pain Assessment: 0-10 Pain Score: 4  Pain Location: Rt hip Pain Descriptors / Indicators: Aching;Burning Pain Intervention(s): Monitored during session;Limited activity within patient's tolerance;Premedicated before session;Ice applied    Home Living                      Prior Function            PT Goals (current goals can now be found in the care plan section) Acute Rehab PT Goals Patient Stated Goal: to be able to walk without pain and return home PT Goal Formulation: With patient Time For Goal Achievement: 05/27/19 Potential to Achieve Goals: Good Progress towards PT goals:  Progressing toward goals    Frequency    7X/week      PT Plan Current plan remains appropriate    Co-evaluation              AM-PAC PT "6 Clicks" Mobility   Outcome Measure  Help needed turning from your back to your side while in a flat bed without using bedrails?: A Little Help needed  moving from lying on your back to sitting on the side of a flat bed without using bedrails?: A Little Help needed moving to and from a bed to a chair (including a wheelchair)?: A Little Help needed standing up from a chair using your arms (e.g., wheelchair or bedside chair)?: A Little Help needed to walk in hospital room?: A Little Help needed climbing 3-5 steps with a railing? : A Little 6 Click Score: 18    End of Session Equipment Utilized During Treatment: Gait belt Activity Tolerance: Patient tolerated treatment well Patient left: in bed;with call bell/phone within reach Nurse Communication: Mobility status PT Visit Diagnosis: Unsteadiness on feet (R26.81);Other abnormalities of gait and mobility (R26.89);Muscle weakness (generalized) (M62.81);Difficulty in walking, not elsewhere classified (R26.2);Pain Pain - Right/Left: Right Pain - part of body: Hip     Time: 1530-1550 PT Time Calculation (min) (ACUTE ONLY): 20 min  Charges:  $Gait Training: 8-22 mins $Therapeutic Exercise: 8-22 mins                     Mauro Kaufmann PT Acute Rehabilitation Services Pager [phone] Office [phone]    Saori Umholtz 05/21/2019, 4:04 PM

## 2019-05-21 NOTE — Progress Notes (Signed)
Physical Therapy Treatment Patient Details Name: Marissa Barnes MRN: [id number] DOB: 04-29-1966 Today's Date: 05/21/2019    History of Present Illness Patient is 53 y.o. female s/p Rt THA ant appraoch on 05/20/19 with PMH significant for anxiety and depression.    PT Comments    Pt progressing well with mobility.   Follow Up Recommendations  Follow surgeon's recommendation for DC plan and follow-up therapies     Equipment Recommendations  Rolling walker with 5" wheels    Recommendations for Other Services       Precautions / Restrictions Precautions Precautions: Fall Restrictions Weight Bearing Restrictions: No Other Position/Activity Restrictions: WBAT    Mobility  Bed Mobility Overal bed mobility: Needs Assistance Bed Mobility: Supine to Sit     Supine to sit: Min guard     General bed mobility comments: Cues for sequence and use of L LE to self assist  Transfers Overall transfer level: Needs assistance Equipment used: Rolling walker (2 wheeled) Transfers: Sit to/from Stand Sit to Stand: Min assist;Min guard         General transfer comment: cues for LE management and use of UEs to self assist  Ambulation/Gait Ambulation/Gait assistance: Min assist;Min guard Gait Distance (Feet): 130 Feet Assistive device: Rolling walker (2 wheeled) Gait Pattern/deviations: Step-to pattern;Step-through pattern;Decreased step length - right;Decreased step length - left;Shuffle;Trunk flexed Gait velocity: decr   General Gait Details: cues for sequence, posture and position from [employer]             Wheelchair Mobility    Modified Rankin (Stroke Patients Only)       Balance Overall balance assessment: Needs assistance Sitting-balance support: No upper extremity supported;Feet supported Sitting balance-Leahy Scale: Good     Standing balance support: During functional activity;Bilateral upper extremity supported Standing balance-Leahy Scale: Fair                               Cognition Arousal/Alertness: Awake/alert Behavior During Therapy: WFL for tasks assessed/performed Overall Cognitive Status: Within Functional Limits for tasks assessed                                        Exercises Total Joint Exercises Ankle Circles/Pumps: AROM;Seated;Both;15 reps Quad Sets: AROM;Both;10 reps;Supine Heel Slides: AAROM;Right;20 reps;Supine Hip ABduction/ADduction: AAROM;Right;15 reps;Supine    General Comments        Pertinent Vitals/Pain Pain Assessment: 0-10 Pain Score: 5  Pain Location: Rt hip Pain Descriptors / Indicators: Aching;Burning Pain Intervention(s): Limited activity within patient's tolerance;Monitored during session;Premedicated before session;Ice applied    Home Living                      Prior Function            PT Goals (current goals can now be found in the care plan section) Acute Rehab PT Goals Patient Stated Goal: to be able to walk without pain and return home PT Goal Formulation: With patient Time For Goal Achievement: 05/27/19 Potential to Achieve Goals: Good Progress towards PT goals: Progressing toward goals    Frequency    7X/week      PT Plan Current plan remains appropriate    Co-evaluation              AM-PAC PT "6 Clicks" Mobility   Outcome Measure  Help needed turning from your back to your side while in a flat bed without using bedrails?: A Little Help needed moving from lying on your back to sitting on the side of a flat bed without using bedrails?: A Little Help needed moving to and from a bed to a chair (including a wheelchair)?: A Little Help needed standing up from a chair using your arms (e.g., wheelchair or bedside chair)?: A Little Help needed to walk in hospital room?: A Little Help needed climbing 3-5 steps with a railing? : A Lot 6 Click Score: 17    End of Session Equipment Utilized During Treatment: Gait  belt Activity Tolerance: Patient tolerated treatment well Patient left: in bed;with call bell/phone within reach Nurse Communication: Mobility status PT Visit Diagnosis: Unsteadiness on feet (R26.81);Other abnormalities of gait and mobility (R26.89);Muscle weakness (generalized) (M62.81);Difficulty in walking, not elsewhere classified (R26.2);Pain Pain - Right/Left: Right Pain - part of body: Hip     Time: 8338-2505 PT Time Calculation (min) (ACUTE ONLY): 30 min  Charges:  $Gait Training: 8-22 mins $Therapeutic Exercise: 8-22 mins                     Marissa Barnes PT Acute Rehabilitation Services Pager [phone] Office [phone]    Marissa Barnes 05/21/2019, 2:09 PM

## 2019-05-21 NOTE — Progress Notes (Signed)
   Subjective: 1 Day Post-Op Procedure(s) (LRB): TOTAL HIP ARTHROPLASTY ANTERIOR APPROACH (Right) Patient reports pain as mild.   Plan is to go Home after hospital stay.  Objective: Vital signs in last 24 hours: Temp:  [97.5 F (36.4 C)-98.8 F (37.1 C)] 98 F (36.7 C) (10/01 0436) Pulse Rate:  [62-80] 68 (10/01 0436) Resp:  [11-29] 16 (10/01 0436) BP: (104-117)/(68-88) 110/69 (10/01 0436) SpO2:  [100 %] 100 % (10/01 0436) Weight:  [70.3 kg] 70.3 kg (09/30 1155)  Intake/Output from previous day:  Intake/Output Summary (Last 24 hours) at 05/21/2019 0713 Last data filed at 05/21/2019 0624 Gross per 24 hour  Intake 3637.6 ml  Output 4205 ml  Net -567.4 ml    Intake/Output this shift: No intake/output data recorded.  Labs: Recent Labs    05/21/19 0238  HGB 9.7*   Recent Labs    05/21/19 0238  WBC 10.1  RBC 3.35*  HCT 29.4*  PLT 194   Recent Labs    05/21/19 0238  NA 140  K 4.2  CL 107  CO2 26  BUN 12  CREATININE 0.73  GLUCOSE 135*  CALCIUM 8.5*   No results for input(s): LABPT, INR in the last 72 hours.  EXAM General - Patient is Alert, Appropriate and Oriented Extremity - Neurologically intact Neurovascular intact No cellulitis present Compartment soft Dressing - dressing C/D/I Motor Function - intact, moving foot and toes well on exam.  Hemovac pulled without difficulty.  Past Medical History:  Diagnosis Date  . Anxiety   . Depression     Assessment/Plan: 1 Day Post-Op Procedure(s) (LRB): TOTAL HIP ARTHROPLASTY ANTERIOR APPROACH (Right) Principal Problem:   OA (osteoarthritis) of hip   Advance diet Up with therapy D/C IV fluids  Discharge home  DVT Prophylaxis - Aspirin Weight Bearing As Tolerated right Leg Hemovac Pulled Begin Therapy  Pilar Plate Felder Lebeda

## 2019-05-21 NOTE — Progress Notes (Signed)
Physical Therapy Treatment Patient Details Name: Marissa Barnes MRN: [id number] DOB: [birth year] Today's Date: 05/21/2019    History of Present Illness Patient is 53 y.o. female s/p Rt THA ant appraoch on 05/20/19 with PMH significant for anxiety and depression.    PT Comments    Pt performed home therex program with assist.  Written instruction and progression provided and reviewed.   Follow Up Recommendations  Follow surgeon's recommendation for DC plan and follow-up therapies     Equipment Recommendations  Rolling walker with 5" wheels;3in1 (PT)    Recommendations for Other Services       Precautions / Restrictions Precautions Precautions: Fall Restrictions Weight Bearing Restrictions: No Other Position/Activity Restrictions: WBAT    Mobility  Bed Mobility Overal bed mobility: Needs Assistance Bed Mobility: Supine to Sit     Supine to sit: Supervision     General bed mobility comments: Cues for sequence and use of L LE to self assist  Transfers Overall transfer level: Needs assistance Equipment used: Rolling walker (2 wheeled) Transfers: Sit to/from Stand Sit to Stand: Min assist;Min guard         General transfer comment: cues for LE management and use of UEs to self assist  Ambulation/Gait Ambulation/Gait assistance: Min assist;Min guard Gait Distance (Feet): 130 Feet Assistive device: Rolling walker (2 wheeled) Gait Pattern/deviations: Step-to pattern;Step-through pattern;Decreased step length - right;Decreased step length - left;Shuffle;Trunk flexed Gait velocity: decr   General Gait Details: cues for sequence, posture and position from [employer]             Wheelchair Mobility    Modified Rankin (Stroke Patients Only)       Balance Overall balance assessment: Needs assistance Sitting-balance support: No upper extremity supported;Feet supported Sitting balance-Leahy Scale: Good     Standing balance support: During functional  activity;Bilateral upper extremity supported Standing balance-Leahy Scale: Fair                              Cognition Arousal/Alertness: Awake/alert Behavior During Therapy: WFL for tasks assessed/performed Overall Cognitive Status: Within Functional Limits for tasks assessed                                        Exercises Total Joint Exercises Ankle Circles/Pumps: AROM;Both;15 reps;Supine Quad Sets: AROM;Both;10 reps;Supine Heel Slides: AAROM;Right;Supine;15 reps Hip ABduction/ADduction: AAROM;Right;15 reps;Supine Long Arc Quad: AAROM;AROM;Right;10 reps;Seated    General Comments        Pertinent Vitals/Pain Pain Assessment: 0-10 Pain Score: 4  Pain Location: Rt hip Pain Descriptors / Indicators: Aching;Burning Pain Intervention(s): Limited activity within patient's tolerance;Monitored during session;Premedicated before session;Ice applied    Home Living                      Prior Function            PT Goals (current goals can now be found in the care plan section) Acute Rehab PT Goals Patient Stated Goal: to be able to walk without pain and return home PT Goal Formulation: With patient Time For Goal Achievement: 05/27/19 Potential to Achieve Goals: Good Progress towards PT goals: Progressing toward goals    Frequency    7X/week      PT Plan Current plan remains appropriate    Co-evaluation  AM-PAC PT "6 Clicks" Mobility   Outcome Measure  Help needed turning from your back to your side while in a flat bed without using bedrails?: A Little Help needed moving from lying on your back to sitting on the side of a flat bed without using bedrails?: A Little Help needed moving to and from a bed to a chair (including a wheelchair)?: A Little Help needed standing up from a chair using your arms (e.g., wheelchair or bedside chair)?: A Little Help needed to walk in hospital room?: A Little Help needed  climbing 3-5 steps with a railing? : A Lot 6 Click Score: 17    End of Session Equipment Utilized During Treatment: Gait belt Activity Tolerance: Patient tolerated treatment well Patient left: Other (comment)(sitting EOB) Nurse Communication: Mobility status PT Visit Diagnosis: Unsteadiness on feet (R26.81);Other abnormalities of gait and mobility (R26.89);Muscle weakness (generalized) (M62.81);Difficulty in walking, not elsewhere classified (R26.2);Pain Pain - Right/Left: Right Pain - part of body: Hip     Time: 3235-5732 PT Time Calculation (min) (ACUTE ONLY): 22 min  Charges:  $Gait Training: 8-22 mins $Therapeutic Exercise: 8-22 mins                     [place] Pager [phone] Office [phone]    Marissa Barnes 05/21/2019, 3:56 PM

## 2019-05-28 NOTE — Discharge Summary (Signed)
Physician Discharge Summary   Patient ID: Marissa Paulsngela N Cornette MRN: [id number] DOB/AGE: 1965-12-18 53 y.o.  Admit date: 05/20/2019 Discharge date: 05/21/2019  Primary Diagnosis: Osteoarthritis of the right hip  Admission Diagnoses:  Past Medical History:  Diagnosis Date  . Anxiety   . Depression    Discharge Diagnoses:   Principal Problem:   OA (osteoarthritis) of hip  Estimated body mass index is 24.28 kg/m as calculated from the following:   Height as of this encounter: 5\' 7"  (1.702 m).   Weight as of this encounter: 70.3 kg.  Procedure:  Procedure(s) (LRB): TOTAL HIP ARTHROPLASTY ANTERIOR APPROACH (Right)   Consults: None  HPI: Marissa Barnes is a 53 y.o. female who has advanced end-  stage arthritis of their Right  hip with progressively worsening pain and  dysfunction.The patient has failed nonoperative management and presents for  total hip arthroplasty.   Laboratory Data: Admission on 05/20/2019, Discharged on 05/21/2019  Component Date Value Ref Range Status  . WBC 05/21/2019 10.1  4.0 - 10.5 K/uL Final  . RBC 05/21/2019 3.35* 3.87 - 5.11 MIL/uL Final  . Hemoglobin 05/21/2019 9.7* 12.0 - 15.0 g/dL Final  . HCT 40/98/119110/08/2018 29.4* 36.0 - 46.0 % Final  . MCV 05/21/2019 87.8  80.0 - 100.0 fL Final  . MCH 05/21/2019 29.0  26.0 - 34.0 pg Final  . MCHC 05/21/2019 33.0  30.0 - 36.0 g/dL Final  . RDW 47/82/956210/08/2018 13.1  11.5 - 15.5 % Final  . Platelets 05/21/2019 194  150 - 400 K/uL Final  . nRBC 05/21/2019 0.0  0.0 - 0.2 % Final   Performed at [facility], [address] [place], [place] [id number]  . Sodium 05/21/2019 140  135 - 145 mmol/L Final  . Potassium 05/21/2019 4.2  3.5 - 5.1 mmol/L Final  . Chloride 05/21/2019 107  98 - 111 mmol/L Final  . CO2 05/21/2019 26  22 - 32 mmol/L Final  . Glucose, Bld 05/21/2019 135* 70 - 99 mg/dL Final  . BUN 57/84/696210/08/2018 12  6 - 20 mg/dL Final  . Creatinine, Ser 05/21/2019 0.73  0.44 - 1.00 mg/dL Final  . Calcium  95/28/413210/08/2018 8.5* 8.9 - 10.3 mg/dL Final  . GFR calc non Af Amer 05/21/2019 >60  >60 mL/min Final  . GFR calc Af Amer 05/21/2019 >60  >60 mL/min Final  . Anion gap 05/21/2019 7  5 - 15 Final   Performed at [facility], [address] [place], [place] [address]  Hospital Outpatient Visit on 05/16/2019  Component Date Value Ref Range Status  . SARS-CoV-2, NAA 05/16/2019 NOT DETECTED  NOT DETECTED Final   Comment: (NOTE) This nucleic acid amplification test was developed and its performance characteristics determined by [facility]. Nucleic acid amplification tests include PCR and TMA. This test has not been FDA cleared or approved. This test has been authorized by FDA under an Emergency Use Authorization (EUA). This test is only authorized for the duration of time the declaration that circumstances exist justifying the authorization of the emergency use of in vitro diagnostic tests for detection of SARS-CoV-2 virus and/or diagnosis of COVID-19 infection under section 564(b)(1) of the Act, 21 U.S.C. 272ZDG-6(Y360bbb-3(b) (1), unless the authorization is terminated or revoked sooner. When diagnostic testing is negative, the possibility of a false negative result should be considered in the context of a patient's recent exposures and the presence of clinical signs and symptoms consistent with COVID-19. An individual without symptoms of COVID- 19  and who is not shedding SARS-CoV-2 vi                          rus would expect to have a negative (not detected) result in this assay. Performed At: [facility] [address] [place], [place] [id number] Jolene Schimke MD JX:[id number]   . Coronavirus Source 05/16/2019 NASOPHARYNGEAL   Final   Performed at [facility], [address], [place], [place] [address]  Hospital Outpatient Visit on 05/14/2019  Component Date Value Ref Range Status  . aPTT 05/14/2019 30  24 - 36 seconds Final   Performed at [facility], [address] [place], [place] [address]  . WBC 05/14/2019 5.8  4.0 - 10.5 K/uL Final  . RBC 05/14/2019 4.45  3.87 - 5.11 MIL/uL Final  . Hemoglobin 05/14/2019 12.8  12.0 - 15.0 g/dL Final  . HCT 96/29/5284 38.7  36.0 - 46.0 % Final  . MCV 05/14/2019 87.0  80.0 - 100.0 fL Final  . MCH 05/14/2019 28.8  26.0 - 34.0 pg Final  . MCHC 05/14/2019 33.1  30.0 - 36.0 g/dL Final  . RDW 13/24/4010 13.0  11.5 - 15.5 % Final  . Platelets 05/14/2019 244  150 - 400 K/uL Final  . nRBC 05/14/2019 0.0  0.0 - 0.2 % Final   Performed at [facility], [address] [place], [place] [address]  . Sodium 05/14/2019 139  135 - 145 mmol/L Final  . Potassium 05/14/2019 4.2  3.5 - 5.1 mmol/L Final  . Chloride 05/14/2019 103  98 - 111 mmol/L Final  . CO2 05/14/2019 28  22 - 32 mmol/L Final  . Glucose, Bld 05/14/2019 106* 70 - 99 mg/dL Final  . BUN 66/44/0347 13  6 - 20 mg/dL Final  . Creatinine, Ser 05/14/2019 0.67  0.44 - 1.00 mg/dL Final  . Calcium 42/59/5638 9.3  8.9 - 10.3 mg/dL Final  . Total Protein 05/14/2019 7.4  6.5 - 8.1 g/dL Final  . Albumin 75/64/3329 4.2  3.5 - 5.0 g/dL Final  . AST 51/88/4166 18  15 - 41 U/L Final  . ALT 05/14/2019 15  0 - 44 U/L Final  . Alkaline Phosphatase 05/14/2019 110  38 - 126 U/L Final  . Total Bilirubin 05/14/2019 0.6  0.3 - 1.2 mg/dL Final  . GFR calc non Af Amer 05/14/2019 >60  >60 mL/min Final  . GFR calc Af Amer 05/14/2019 >60  >60 mL/min Final  . Anion gap 05/14/2019 8  5 - 15 Final   Performed at [facility], [address], [place], [place] [address]  . Prothrombin Time 05/14/2019 13.0  11.4 - 15.2 seconds Final  . INR 05/14/2019 1.0  0.8 - 1.2 Final   Comment: (NOTE) INR goal varies based on device and disease states. Performed at [facility], [address], [place], [place] [address]   . ABO/RH(D) 05/14/2019 O POS   Final  . Antibody Screen 05/14/2019 NEG   Final  . Sample  Expiration 05/14/2019 05/23/2019,2359   Final  . Extend sample reason 05/14/2019    Final                   Value:NO TRANSFUSIONS OR PREGNANCY IN THE PAST 3 MONTHS Performed at [facility], [address], [place], [place] [address]   . MRSA, PCR 05/14/2019 NEGATIVE  NEGATIVE Final  . Staphylococcus aureus  05/14/2019 POSITIVE* NEGATIVE Final   Comment: (NOTE) The Xpert SA Assay (FDA approved for NASAL specimens in patients 76 years of age and older), is one component of a comprehensive surveillance program. It is not intended to diagnose infection nor to guide or monitor treatment. Performed at [facility], [address], [place], [place] [address]   . ABO/RH(D) 05/14/2019    Final                   Value:O POS Performed at [facility], [address] [place], [place] [address]      X-Rays:Dg Pelvis Portable  Result Date: 05/20/2019 CLINICAL DATA:  Status post right total hip replacement. EXAM: PORTABLE PELVIS 1-2 VIEWS COMPARISON:  Radiograph of same day. FINDINGS: There has been successful reduction of right hip prosthesis dislocation noted on prior exam. Postsurgical changes and surgical drain are seen in the surrounding soft tissues. The right femoral and acetabular components are well situated. IMPRESSION: Successful reduction of right hip prosthesis dislocation noted on prior exam. Electronically Signed   By: Lupita Raider M.D.   On: 05/20/2019 15:09   Dg Pelvis Portable  Result Date: 05/20/2019 CLINICAL DATA:  Postop right hip arthroplasty EXAM: PORTABLE PELVIS 1-2 VIEWS COMPARISON:  Intraoperative fluoroscopic images 05/20/2019 FINDINGS: Superolateral dislocation of the femoral component of right total hip arthroplasty relative to the acetabular cup. Femur appears externally rotated. Expected postoperative changes within the soft tissues including soft tissue drain. No periprosthetic fracture is evident. IMPRESSION:  Right total hip arthroplasty dislocation. Postreduction images were submitted to PACS at the time of dictation. Electronically Signed   By: Duanne Guess M.D.   On: 05/20/2019 15:03   Dg C-arm 1-60 Min-no Report  Result Date: 05/20/2019 CLINICAL DATA:  Status post total hip arthroplasty right hip EXAM: OPERATIVE right HIP (WITH PELVIS IF PERFORMED) 1 VIEWS TECHNIQUE: Fluoroscopic spot image(s) were submitted for interpretation post-operatively. COMPARISON:  None. FINDINGS: Two portable radiographs obtained in the operating room via C-arm radiography show postoperative change from right hip arthroplasty. The hardware components are in anatomic alignment. No periprosthetic fracture. IMPRESSION: 1. Status post right total hip arthroplasty. Electronically Signed   By: Signa Kell M.D.   On: 05/20/2019 14:40   Dg Hip Operative Unilat W Or W/o Pelvis Right  Result Date: 05/20/2019 CLINICAL DATA:  Status post total hip arthroplasty right hip EXAM: OPERATIVE right HIP (WITH PELVIS IF PERFORMED) 1 VIEWS TECHNIQUE: Fluoroscopic spot image(s) were submitted for interpretation post-operatively. COMPARISON:  None. FINDINGS: Two portable radiographs obtained in the operating room via C-arm radiography show postoperative change from right hip arthroplasty. The hardware components are in anatomic alignment. No periprosthetic fracture. IMPRESSION: 1. Status post right total hip arthroplasty. Electronically Signed   By: Signa Kell M.D.   On: 05/20/2019 14:40    EKG:No orders found for this or any previous visit.   Hospital Course: CHLOE MIYOSHI is a 53 y.o. who was admitted to [facility]. They were brought to the operating room on 05/20/2019 and underwent Procedure(s): TOTAL HIP ARTHROPLASTY ANTERIOR APPROACH.  Patient tolerated the procedure well and was later transferred to the recovery room and then to the orthopaedic floor for postoperative care. They were given PO and IV analgesics for pain  control following their surgery. They were given 24 hours of postoperative antibiotics of  Anti-infectives (From admission, onward)   Start     Dose/Rate Route Frequency Ordered Stop   05/20/19 1900  ceFAZolin (  ANCEF) IVPB 1 g/50 mL premix     1 g 100 mL/hr over 30 Minutes Intravenous Every 6 hours 05/20/19 1543 05/21/19 0144   05/20/19 1145  ceFAZolin (ANCEF) IVPB 2g/100 mL premix     2 g 200 mL/hr over 30 Minutes Intravenous On call to O.R. 05/20/19 1137 05/20/19 1336     and started on DVT prophylaxis in the form of Aspirin.   PT and OT were ordered for total joint protocol. Discharge planning consulted to help with postop disposition and equipment needs.  In PACU, post-operative films were taken and noted anterior hip dislocation. She had been "very active" in moving around in bed, and her spinal anesthesia was still intact. Dr. Lequita Halt was able to apply traction and internal rotation to reduce the hip. Post-reduction films confirmed concentric reduction. Patient had an otherwise uneventful night on the evening of surgery. They started to get up OOB with therapy on POD #1. Pt was seen during rounds and was ready to go home pending progress with therapy. Hemovac drain was pulled without difficulty. She worked with therapy on POD #1 and was meeting her goals. Pt was discharged to home later that day in stable condition.  Diet: Regular diet Activity: WBAT Follow-up: in 2 weeks Disposition: Home Discharged Condition: good   Discharge Instructions    Call MD / Call 911   Complete by: As directed    If you experience chest pain or shortness of breath, CALL 911 and be transported to the hospital emergency room.  If you develope a fever above 101 F, pus (white drainage) or increased drainage or redness at the wound, or calf pain, call your surgeon's office.   Change dressing   Complete by: As directed    You may change your dressing on Friday, then change the dressing daily with sterile 4 x 4  inch gauze dressing and paper tape for one week.   Constipation Prevention   Complete by: As directed    Drink plenty of fluids.  Prune juice may be helpful.  You may use a stool softener, such as Colace (over the counter) 100 mg twice a day.  Use MiraLax (over the counter) for constipation as needed.   Diet - low sodium heart healthy   Complete by: As directed    Discharge instructions   Complete by: As directed    Dr. Ollen Gross Total Joint Specialist [facility] [address] [place], [place] [phone]  ANTERIOR APPROACH TOTAL HIP REPLACEMENT POSTOPERATIVE DIRECTIONS   Hip Rehabilitation, Guidelines Following Surgery  The results of a hip operation are greatly improved after range of motion and muscle strengthening exercises. Follow all safety measures which are given to protect your hip. If any of these exercises cause increased pain or swelling in your joint, decrease the amount until you are comfortable again. Then slowly increase the exercises. Call your caregiver if you have problems or questions.   HOME CARE INSTRUCTIONS  Remove items at home which could result in a fall. This includes throw rugs or furniture in walking pathways.  ICE to the affected hip every three hours for 30 minutes at a time and then as needed for pain and swelling.  Continue to use ice on the hip for pain and swelling from surgery. You may notice swelling that will progress down to the foot and ankle.  This is normal after surgery.  Elevate the leg when you are not up walking on it.   Continue  to use the breathing machine which will help keep your temperature down.  It is common for your temperature to cycle up and down following surgery, especially at night when you are not up moving around and exerting yourself.  The breathing machine keeps your lungs expanded and your temperature down.  DIET You may resume your previous home diet once your are discharged from the hospital.   DRESSING / WOUND CARE / SHOWERING You may change your dressing 3-5 days after surgery.  Then change the dressing every day with sterile gauze.  Please use good hand washing techniques before changing the dressing.  Do not use any lotions or creams on the incision until instructed by your surgeon. You may start showering once you are discharged home but do not submerge the incision under water. Just pat the incision dry and apply a dry gauze dressing on daily. Change the surgical dressing daily and reapply a dry dressing each time.  ACTIVITY Walk with your walker as instructed. Use walker as long as suggested by your caregivers. Avoid periods of inactivity such as sitting longer than an hour when not asleep. This helps prevent blood clots.  You may resume a sexual relationship in one month or when given the OK by your doctor.  You may return to work once you are cleared by your doctor.  Do not drive a car for 6 weeks or until released by you surgeon.  Do not drive while taking narcotics.  WEIGHT BEARING Weight bearing as tolerated with assist device (walker, cane, etc) as directed, use it as long as suggested by your surgeon or therapist, typically at least 4-6 weeks.  POSTOPERATIVE CONSTIPATION PROTOCOL Constipation - defined medically as fewer than three stools per week and severe constipation as less than one stool per week.  One of the most common issues patients have following surgery is constipation.  Even if you have a regular bowel pattern at home, your normal regimen is likely to be disrupted due to multiple reasons following surgery.  Combination of anesthesia, postoperative narcotics, change in appetite and fluid intake all can affect your bowels.  In order to avoid complications following surgery, here are some recommendations in order to help you during your recovery period.  Colace (docusate) - Pick up an over-the-counter form of Colace or another stool softener and take twice a  day as long as you are requiring postoperative pain medications.  Take with a full glass of water daily.  If you experience loose stools or diarrhea, hold the colace until you stool forms back up.  If your symptoms do not get better within 1 week or if they get worse, check with your doctor.  Dulcolax (bisacodyl) - Pick up over-the-counter and take as directed by the product packaging as needed to assist with the movement of your bowels.  Take with a full glass of water.  Use this product as needed if not relieved by Colace only.   MiraLax (polyethylene glycol) - Pick up over-the-counter to have on hand.  MiraLax is a solution that will increase the amount of water in your bowels to assist with bowel movements.  Take as directed and can mix with a glass of water, juice, soda, coffee, or tea.  Take if you go more than two days without a movement. Do not use MiraLax more than once per day. Call your doctor if you are still constipated or irregular after using this medication for 7 days in a row.  If you continue  to have problems with postoperative constipation, please contact the office for further assistance and recommendations.  If you experience "the worst abdominal pain ever" or develop nausea or vomiting, please contact the office immediatly for further recommendations for treatment.  ITCHING  If you experience itching with your medications, try taking only a single pain pill, or even half a pain pill at a time.  You can also use Benadryl over the counter for itching or also to help with sleep.   TED HOSE STOCKINGS Wear the elastic stockings on both legs for three weeks following surgery during the day but you may remove then at night for sleeping.  MEDICATIONS See your medication summary on the "After Visit Summary" that the nursing staff will review with you prior to discharge.  You may have some home medications which will be placed on hold until you complete the course of blood thinner  medication.  It is important for you to complete the blood thinner medication as prescribed by your surgeon.  Continue your approved medications as instructed at time of discharge.  PRECAUTIONS If you experience chest pain or shortness of breath - call 911 immediately for transfer to the hospital emergency department.  If you develop a fever greater that 101 F, purulent drainage from wound, increased redness or drainage from wound, foul odor from the wound/dressing, or calf pain - CONTACT YOUR SURGEON.                                                   FOLLOW-UP APPOINTMENTS Make sure you keep all of your appointments after your operation with your surgeon and caregivers. You should call the office at the above phone number and make an appointment for approximately two weeks after the date of your surgery or on the date instructed by your surgeon outlined in the "After Visit Summary".  RANGE OF MOTION AND STRENGTHENING EXERCISES  These exercises are designed to help you keep full movement of your hip joint. Follow your caregiver's or physical therapist's instructions. Perform all exercises about fifteen times, three times per day or as directed. Exercise both hips, even if you have had only one joint replacement. These exercises can be done on a training (exercise) mat, on the floor, on a table or on a bed. Use whatever works the best and is most comfortable for you. Use music or television while you are exercising so that the exercises are a pleasant break in your day. This will make your life better with the exercises acting as a break in routine you can look forward to.  Lying on your back, slowly slide your foot toward your buttocks, raising your knee up off the floor. Then slowly slide your foot back down until your leg is straight again.  Lying on your back spread your legs as far apart as you can without causing discomfort.  Lying on your side, raise your upper leg and foot straight up from the  floor as far as is comfortable. Slowly lower the leg and repeat.  Lying on your back, tighten up the muscle in the front of your thigh (quadriceps muscles). You can do this by keeping your leg straight and trying to raise your heel off the floor. This helps strengthen the largest muscle supporting your knee.  Lying on your back, tighten up the muscles of your  buttocks both with the legs straight and with the knee bent at a comfortable angle while keeping your heel on the floor.   IF YOU ARE TRANSFERRED TO A SKILLED REHAB FACILITY If the patient is transferred to a skilled rehab facility following release from the hospital, a list of the current medications will be sent to the facility for the patient to continue.  When discharged from the skilled rehab facility, please have the facility set up the patient's Home Health Physical Therapy prior to being released. Also, the skilled facility will be responsible for providing the patient with their medications at time of release from the facility to include their pain medication, the muscle relaxants, and their blood thinner medication. If the patient is still at the rehab facility at time of the two week follow up appointment, the skilled rehab facility will also need to assist the patient in arranging follow up appointment in our office and any transportation needs.  MAKE SURE YOU:  Understand these instructions.  Get help right away if you are not doing well or get worse.    Pick up stool softner and laxative for home use following surgery while on pain medications. Do not submerge incision under water. Please use good hand washing techniques while changing dressing each day. May shower starting three days after surgery. Please use a clean towel to pat the incision dry following showers. Continue to use ice for pain and swelling after surgery. Do not use any lotions or creams on the incision until instructed by your surgeon.   Do not sit on low  chairs, stoools or toilet seats, as it may be difficult to get up from low surfaces   Complete by: As directed    Driving restrictions   Complete by: As directed    No driving for two weeks   TED hose   Complete by: As directed    Use stockings (TED hose) for three weeks on both leg(s).  You may remove them at night for sleeping.   Weight bearing as tolerated   Complete by: As directed      Allergies as of 05/21/2019   No Known Allergies     Medication List    TAKE these medications   ALPRAZolam 0.5 MG tablet Commonly known as: XANAX Take 0.5 mg by mouth 2 (two) times daily as needed for anxiety.   amphetamine-dextroamphetamine 30 MG tablet Commonly known as: ADDERALL Take 30 mg by mouth 3 (three) times daily.   aspirin 325 MG EC tablet Take 1 tablet (325 mg total) by mouth 2 (two) times daily for 20 days. Take one tablet (325 mg) Aspirin two times a day for three weeks following surgery.Then take one baby Aspirin (81 mg) once a day for three weeks.Then discontinue aspirin.   HYDROcodone-acetaminophen 7.5-325 MG tablet Commonly known as: NORCO Take 1-2 tablets by mouth every 6 (six) hours as needed for severe pain (pain score 7-10).   methocarbamol 500 MG tablet Commonly known as: ROBAXIN Take 1 tablet (500 mg total) by mouth every 6 (six) hours as needed for muscle spasms.   PARoxetine 40 MG tablet Commonly known as: PAXIL Take 40 mg by mouth daily.   polyethylene glycol 17 g packet Commonly known as: MIRALAX / GLYCOLAX Take 17 g by mouth daily.            Discharge Care Instructions  (From admission, onward)         Start     Ordered  05/21/19 0000  Weight bearing as tolerated     05/21/19 0744   05/21/19 0000  Change dressing    Comments: You may change your dressing on Friday, then change the dressing daily with sterile 4 x 4 inch gauze dressing and paper tape for one week.   05/21/19 0744         Follow-up Information    Ollen Gross, MD.  Schedule an appointment as soon as possible for a visit on 06/02/2019.   Specialty: Orthopedic Surgery Contact information: [address] [address] [place] [address] [phone]           Signed: Dennie Bible, PA-C Orthopedic Surgery 05/28/2019, 6:06 PM

## 2020-04-28 IMAGING — MG DIGITAL SCREENING BILATERAL MAMMOGRAM WITH TOMO AND CAD
8 series · 8 of 24 positions shown · non-contrast
Comparison: None.

CLINICAL DATA: Screening.

EXAM:
DIGITAL SCREENING BILATERAL MAMMOGRAM WITH TOMO AND CAD

[R MLO synth-2D]
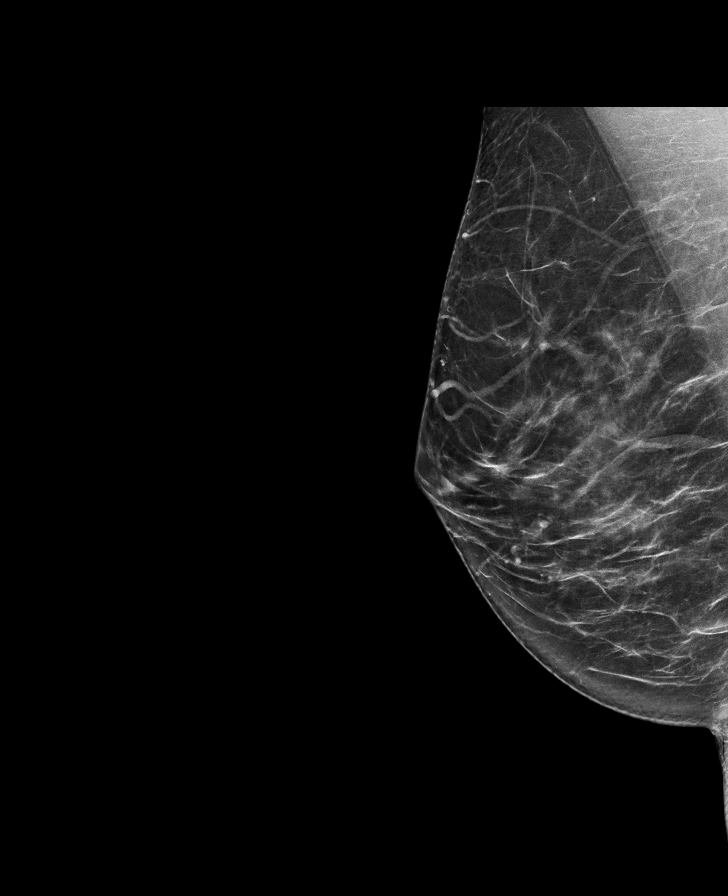

[L CC synth-2D]
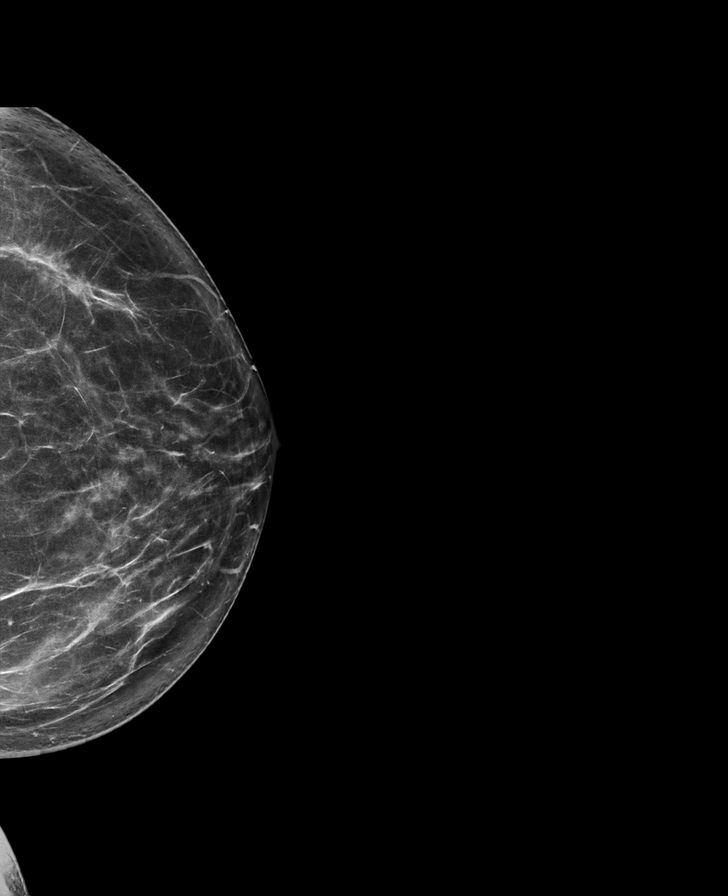

[R CC synth-2D]
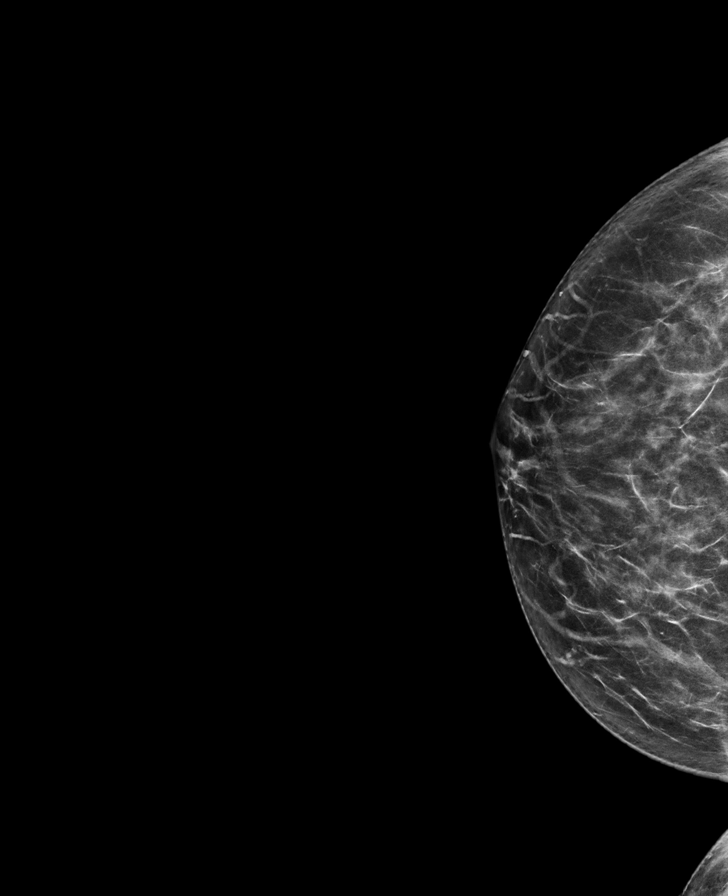

[L MLO synth-2D]
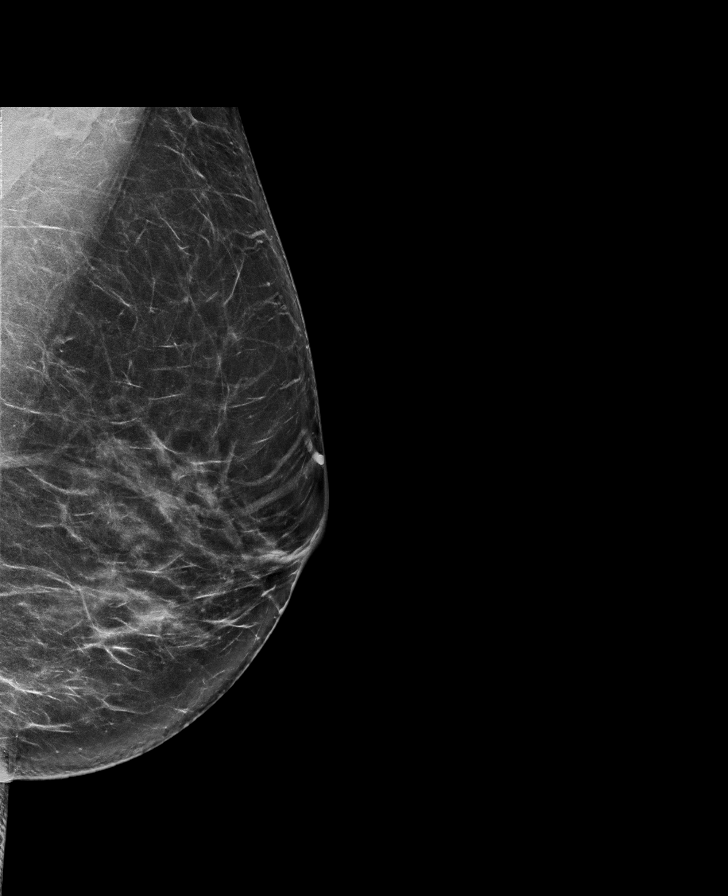

[L CC tomo · tomo slice 41/80.0]
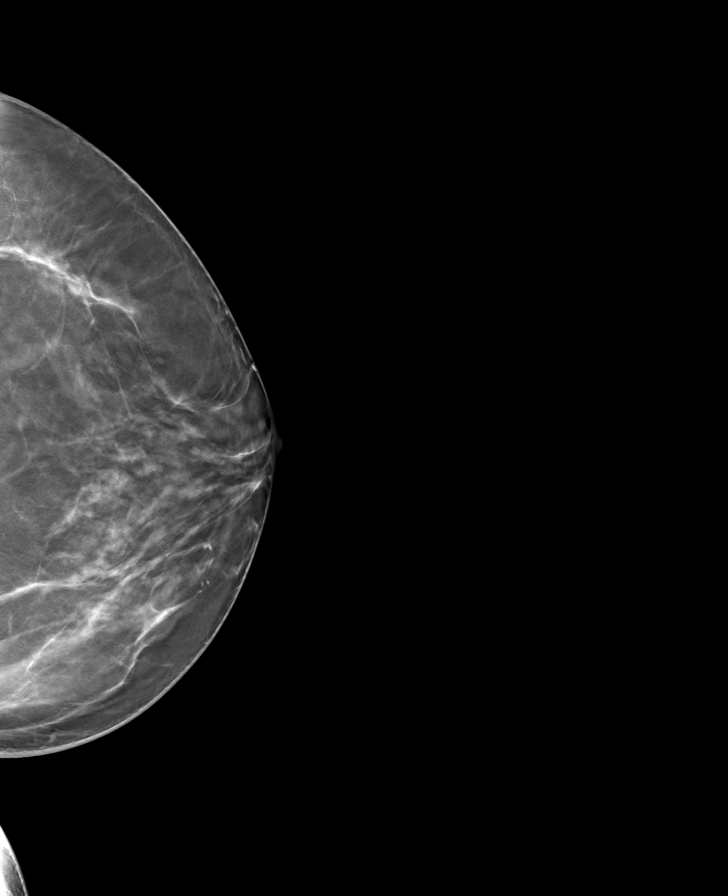

[L MLO tomo · tomo slice 41/82.0]
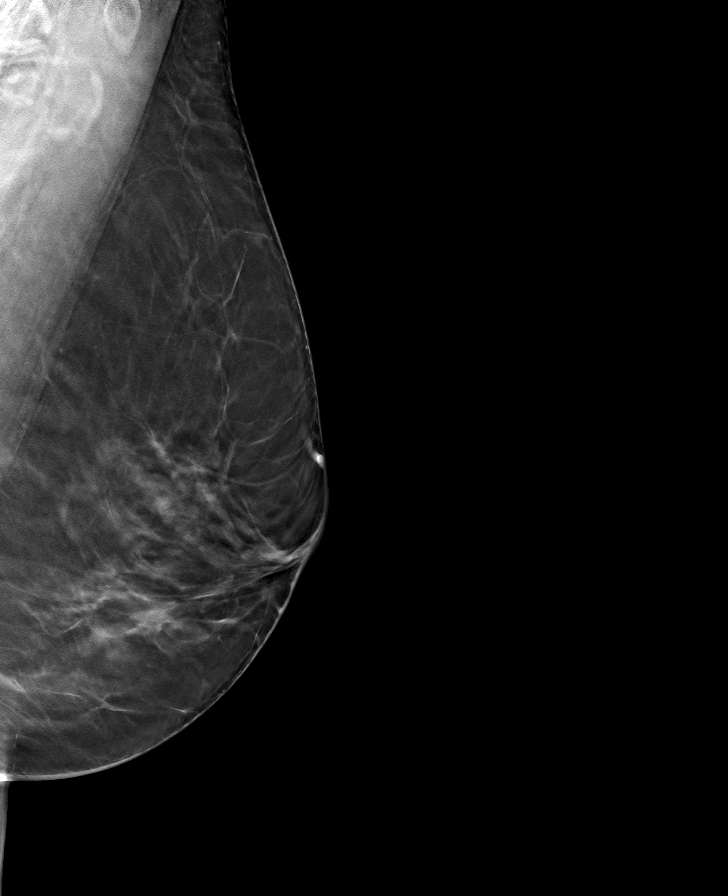

[R MLO tomo · tomo slice 39/78.0]
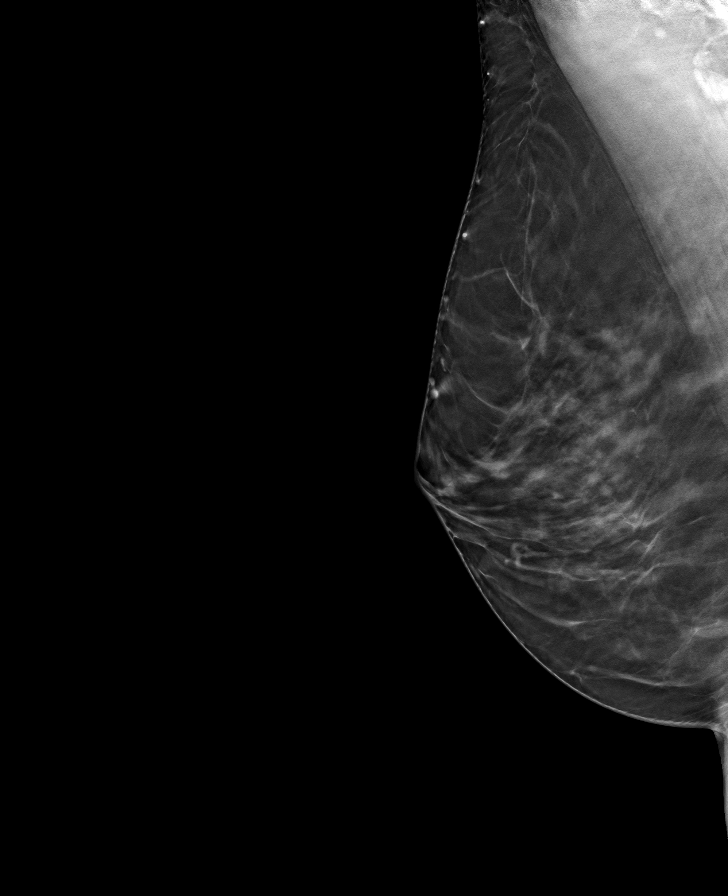

[R CC tomo · tomo slice 37/73.0]
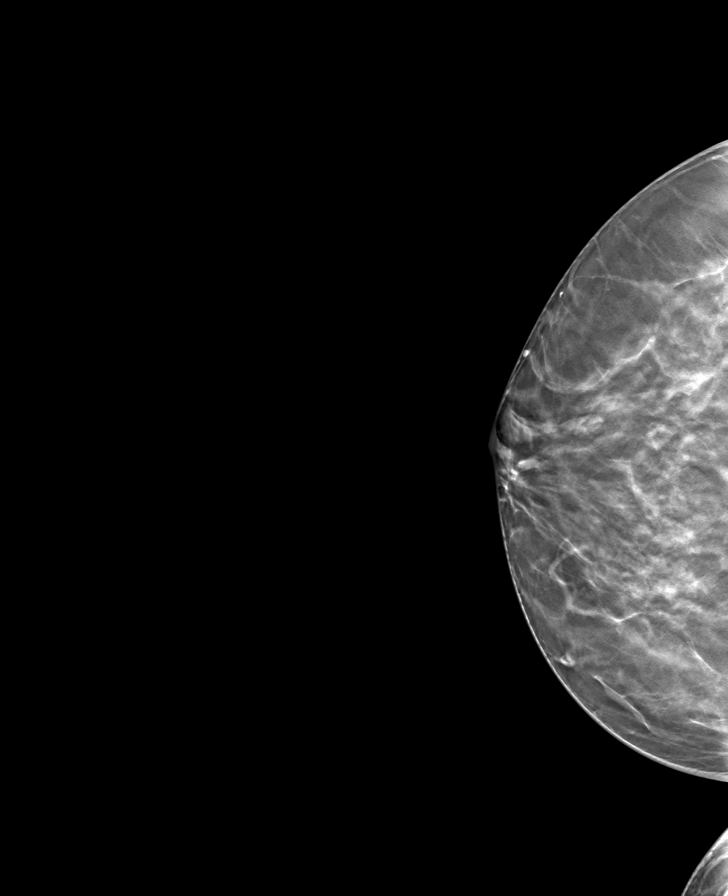

[8 of 24 positions shown; findings below may reference images not displayed]

ACR Breast Density Category b: There are scattered areas of
fibroglandular density.
FINDINGS: There are no findings suspicious for malignancy. Images were
processed with CAD.
IMPRESSION: No mammographic evidence of malignancy. A result letter of this
screening mammogram will be mailed directly to the patient.

RECOMMENDATION:
Screening mammogram in one year. (Code:Y5-G-EJ6)

BI-RADS CATEGORY  1: Negative.

## 2020-12-23 IMAGING — RF DG C-ARM 1-60 MIN-NO REPORT
1 series · 2 of 2 positions shown · non-contrast
Comparison: None.

CLINICAL DATA: Status post total hip arthroplasty right hip

EXAM:
OPERATIVE right HIP (WITH PELVIS IF PERFORMED) 1 VIEWS
TECHNIQUE: Fluoroscopic spot image(s) were submitted for interpretation
post-operatively.

[Series 1: unknown protocol · 0.20mm/px · 2 of 2 slices shown]
[im 1/2]
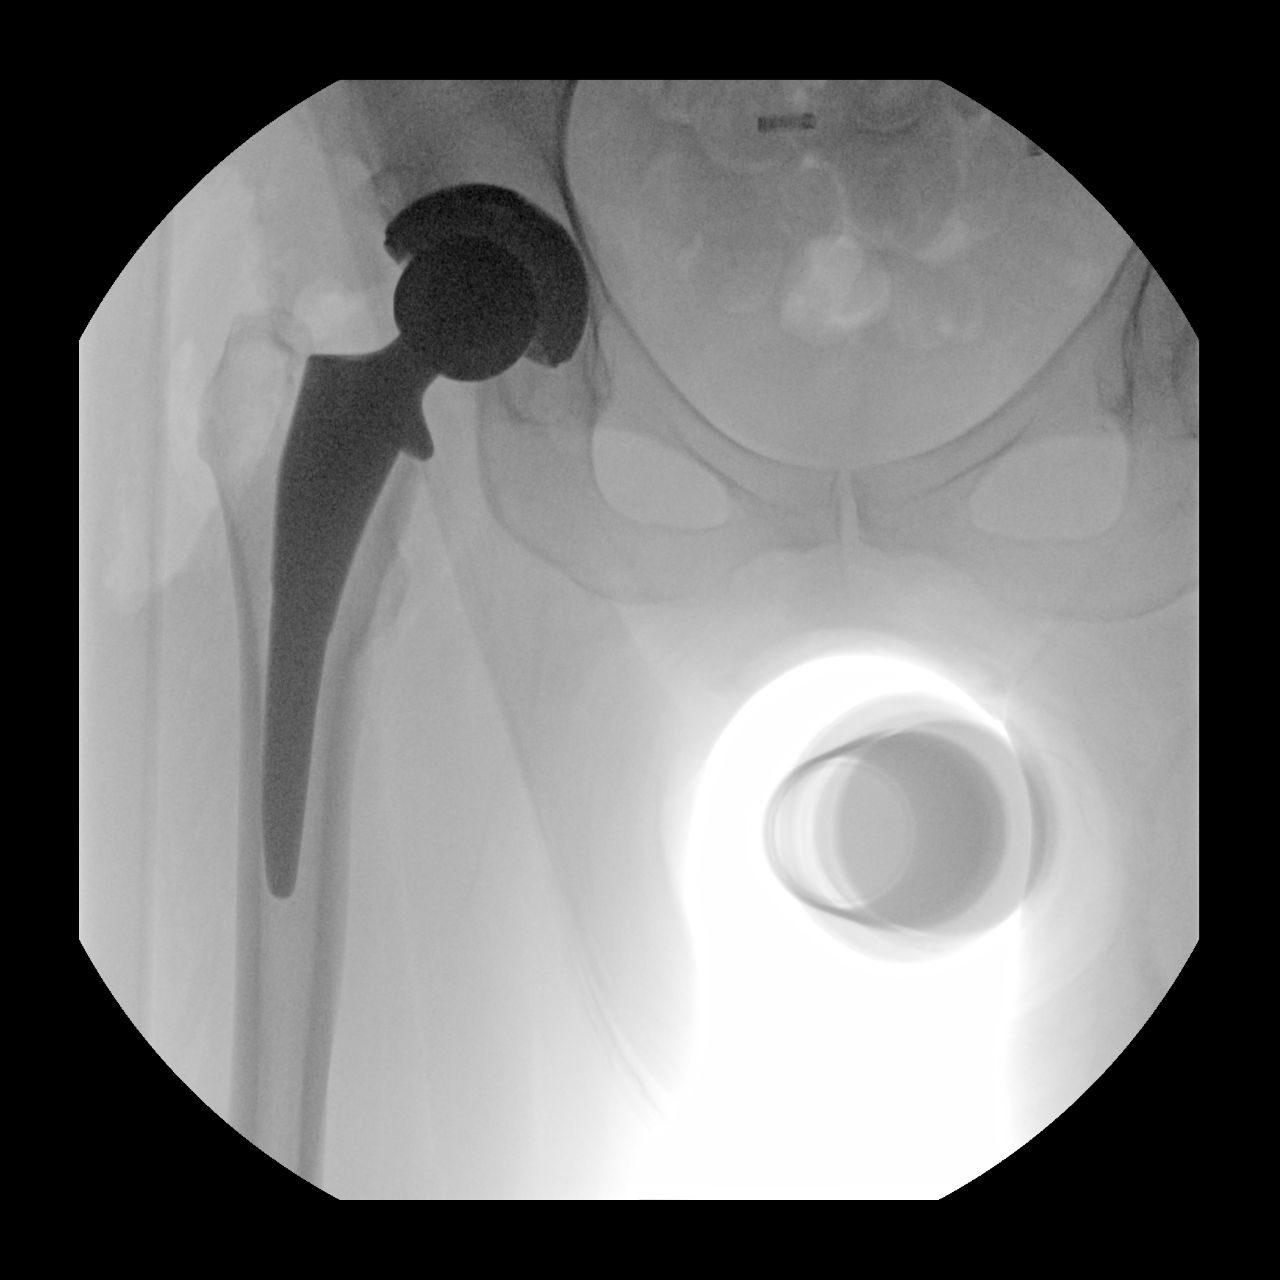
[im 2/2]
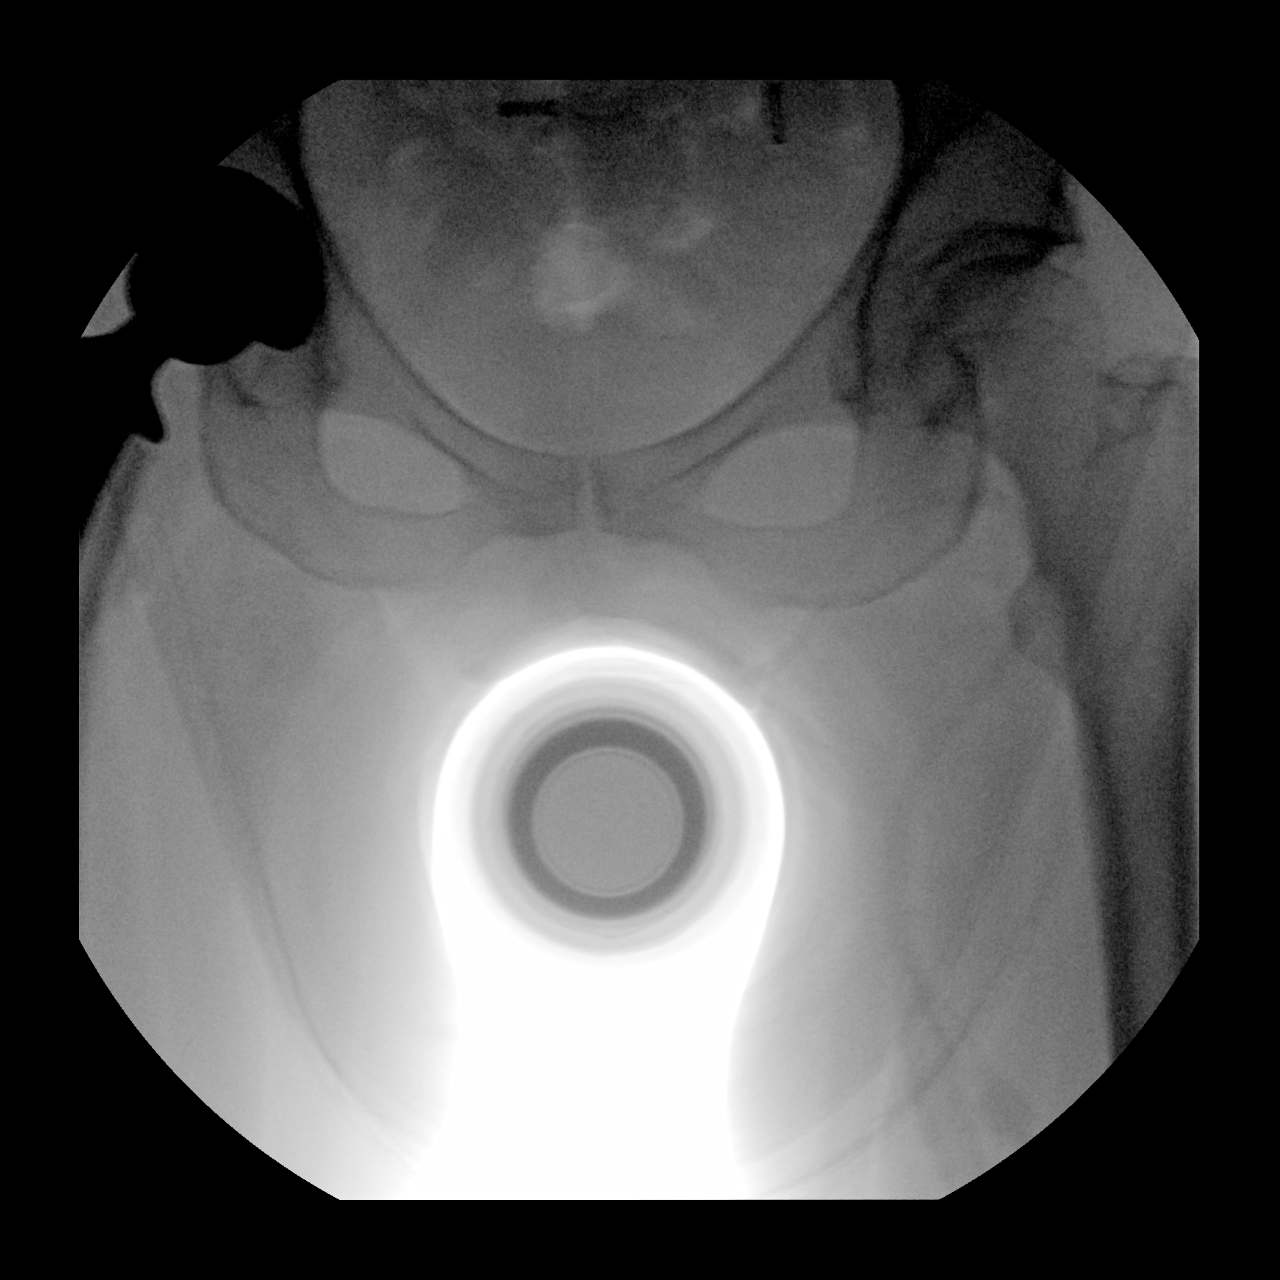

[2 of 2 positions shown; findings below may reference images not displayed]

FINDINGS: Two portable radiographs obtained in the operating room via C-arm
radiography show postoperative change from right hip arthroplasty.
The hardware components are in anatomic alignment. No periprosthetic
fracture.
IMPRESSION: 1. Status post right total hip arthroplasty.

## 2020-12-23 IMAGING — DX DG PORTABLE PELVIS
1 series · 1 of 1 positions shown · non-contrast
Comparison: Intraoperative fluoroscopic images 05/20/2019

CLINICAL DATA: Postop right hip arthroplasty

EXAM:
PORTABLE PELVIS 1-2 VIEWS

[pelvis ap]
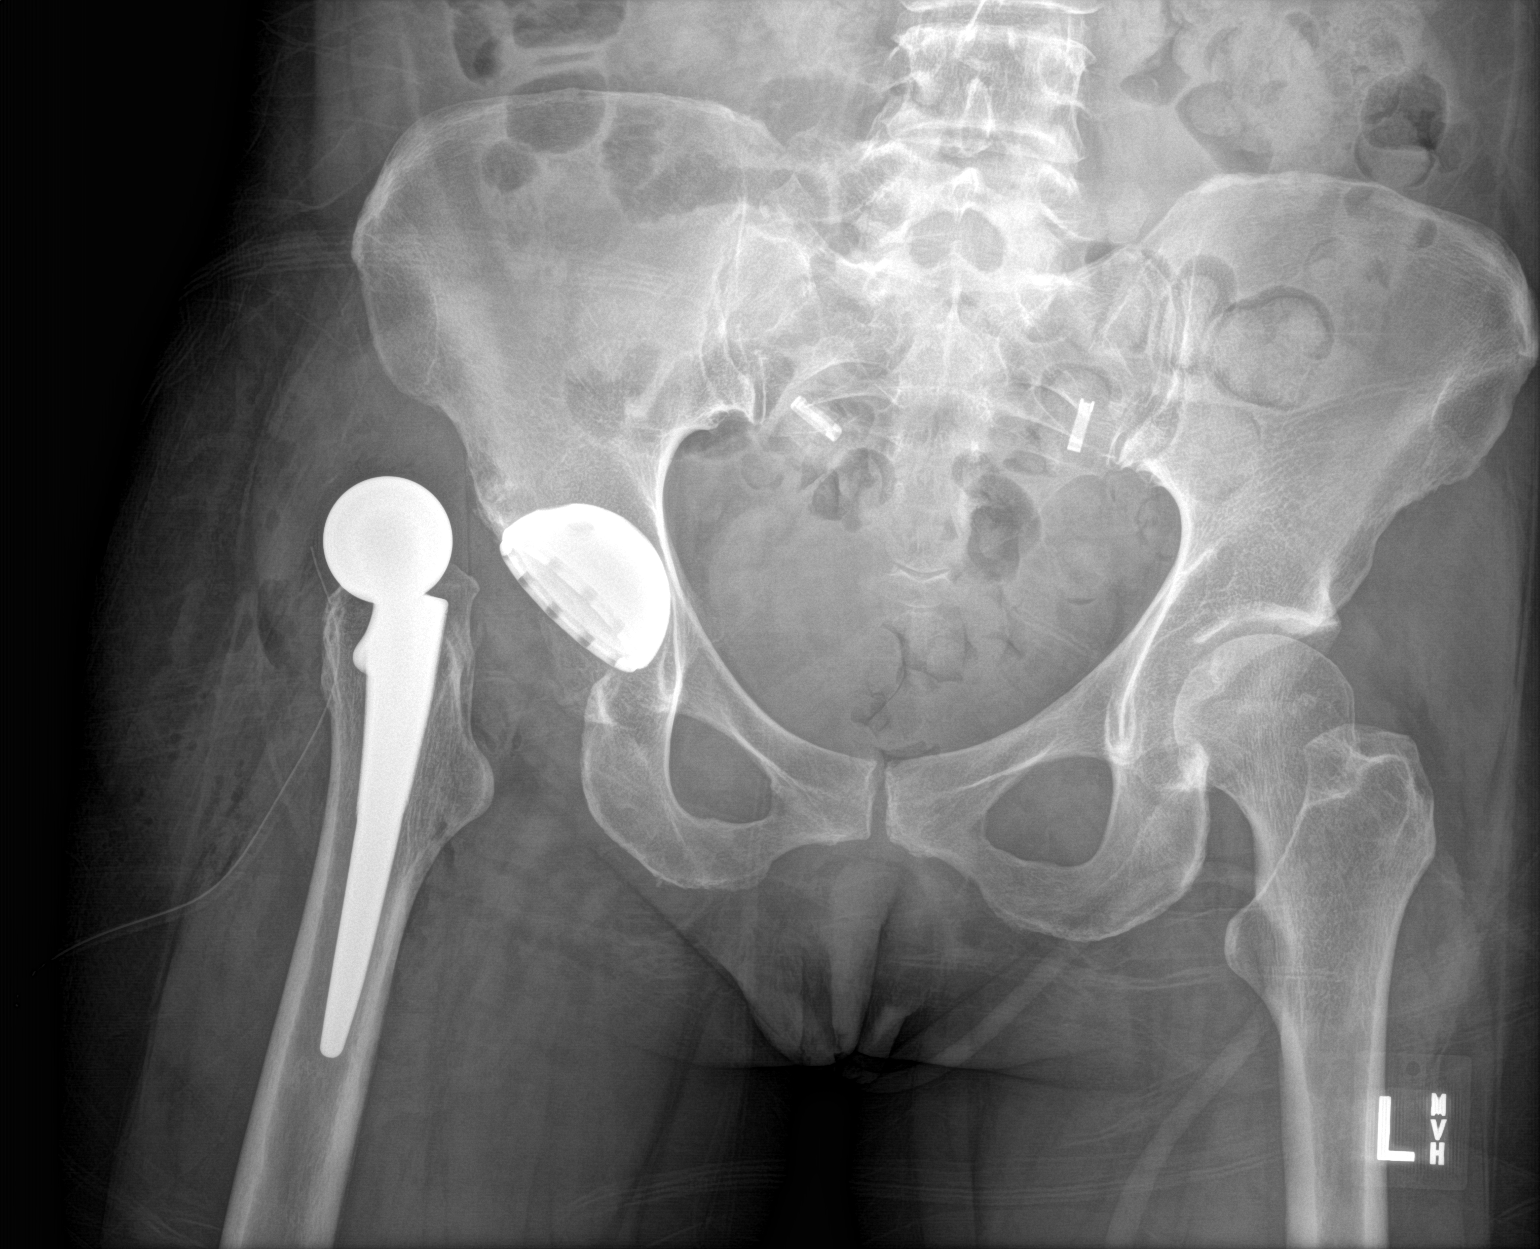

[1 of 1 positions shown; findings below may reference images not displayed]

FINDINGS: Superolateral dislocation of the femoral component of right total
hip arthroplasty relative to the acetabular cup. Femur appears
externally rotated. Expected postoperative changes within the soft
tissues including soft tissue drain. No periprosthetic fracture is
evident.
IMPRESSION: Right total hip arthroplasty dislocation.

Postreduction images were submitted to PACS at the time of
dictation.

## 2021-11-23 LAB — COLOGUARD: COLOGUARD: NEGATIVE

## 2021-11-23 LAB — EXTERNAL GENERIC LAB PROCEDURE: COLOGUARD: NEGATIVE

## 2023-05-31 ENCOUNTER — Other Ambulatory Visit: Payer: Self-pay

## 2023-05-31 ENCOUNTER — Encounter (HOSPITAL_COMMUNITY): Payer: Self-pay | Admitting: Emergency Medicine

## 2023-05-31 ENCOUNTER — Inpatient Hospital Stay (HOSPITAL_COMMUNITY)
Admission: EM | Admit: 2023-05-31 | Discharge: 2023-06-04 | DRG: 310 | Disposition: A | Discharge status: Home / Self Care | Payer: Medicaid Other | Attending: Cardiology | Admitting: Cardiology

## 2023-05-31 ENCOUNTER — Emergency Department (HOSPITAL_COMMUNITY): Payer: Medicaid Other

## 2023-05-31 DIAGNOSIS — I493 Ventricular premature depolarization: Secondary | ICD-10-CM | POA: Diagnosis present

## 2023-05-31 DIAGNOSIS — Z833 Family history of diabetes mellitus: Secondary | ICD-10-CM | POA: Diagnosis not present

## 2023-05-31 DIAGNOSIS — R55 Syncope and collapse: Secondary | ICD-10-CM | POA: Diagnosis not present

## 2023-05-31 DIAGNOSIS — R000 Tachycardia, unspecified: Secondary | ICD-10-CM | POA: Diagnosis not present

## 2023-05-31 DIAGNOSIS — Z5941 Food insecurity: Secondary | ICD-10-CM | POA: Diagnosis not present

## 2023-05-31 DIAGNOSIS — Z79899 Other long term (current) drug therapy: Secondary | ICD-10-CM | POA: Diagnosis not present

## 2023-05-31 DIAGNOSIS — Z83719 Family history of colon polyps, unspecified: Secondary | ICD-10-CM

## 2023-05-31 DIAGNOSIS — Z5986 Financial insecurity: Secondary | ICD-10-CM | POA: Diagnosis not present

## 2023-05-31 DIAGNOSIS — Z8249 Family history of ischemic heart disease and other diseases of the circulatory system: Secondary | ICD-10-CM | POA: Diagnosis not present

## 2023-05-31 DIAGNOSIS — Z96641 Presence of right artificial hip joint: Secondary | ICD-10-CM | POA: Diagnosis present

## 2023-05-31 DIAGNOSIS — E782 Mixed hyperlipidemia: Secondary | ICD-10-CM | POA: Diagnosis not present

## 2023-05-31 DIAGNOSIS — Z803 Family history of malignant neoplasm of breast: Secondary | ICD-10-CM | POA: Diagnosis not present

## 2023-05-31 DIAGNOSIS — F32A Depression, unspecified: Secondary | ICD-10-CM | POA: Diagnosis present

## 2023-05-31 DIAGNOSIS — F419 Anxiety disorder, unspecified: Secondary | ICD-10-CM | POA: Diagnosis present

## 2023-05-31 DIAGNOSIS — E781 Pure hyperglyceridemia: Secondary | ICD-10-CM

## 2023-05-31 DIAGNOSIS — I472 Ventricular tachycardia, unspecified: Secondary | ICD-10-CM | POA: Diagnosis present

## 2023-05-31 DIAGNOSIS — F909 Attention-deficit hyperactivity disorder, unspecified type: Secondary | ICD-10-CM | POA: Diagnosis present

## 2023-05-31 DIAGNOSIS — E282 Polycystic ovarian syndrome: Secondary | ICD-10-CM | POA: Diagnosis present

## 2023-05-31 DIAGNOSIS — I4729 Other ventricular tachycardia: Secondary | ICD-10-CM

## 2023-05-31 LAB — CBC WITH DIFFERENTIAL/PLATELET
Abs Immature Granulocytes: 0.07 10*3/uL (ref 0.00–0.07)
Basophils Absolute: 0 10*3/uL (ref 0.0–0.1)
Basophils Relative: 0 %
Eosinophils Absolute: 0.1 10*3/uL (ref 0.0–0.5)
Eosinophils Relative: 1 %
HCT: 42 % (ref 36.0–46.0)
Hemoglobin: 14.1 g/dL (ref 12.0–15.0)
Immature Granulocytes: 1 %
Lymphocytes Relative: 14 %
Lymphs Abs: 1.3 10*3/uL (ref 0.7–4.0)
MCH: 28.1 pg (ref 26.0–34.0)
MCHC: 33.6 g/dL (ref 30.0–36.0)
MCV: 83.7 fL (ref 80.0–100.0)
Monocytes Absolute: 0.2 10*3/uL (ref 0.1–1.0)
Monocytes Relative: 2 %
Neutro Abs: 7.7 10*3/uL (ref 1.7–7.7)
Neutrophils Relative %: 82 %
Platelets: 242 10*3/uL (ref 150–400)
RBC: 5.02 MIL/uL (ref 3.87–5.11)
RDW: 12.9 % (ref 11.5–15.5)
WBC: 9.4 10*3/uL (ref 4.0–10.5)
nRBC: 0 % (ref 0.0–0.2)

## 2023-05-31 LAB — URINALYSIS, W/ REFLEX TO CULTURE (INFECTION SUSPECTED)
Bilirubin Urine: NEGATIVE
Glucose, UA: NEGATIVE mg/dL
Ketones, ur: NEGATIVE mg/dL
Leukocytes,Ua: NEGATIVE
Nitrite: NEGATIVE
Protein, ur: NEGATIVE mg/dL
Specific Gravity, Urine: 1.01 (ref 1.005–1.030)
pH: 5 (ref 5.0–8.0)

## 2023-05-31 LAB — CBC
HCT: 34.9 % — ABNORMAL LOW (ref 36.0–46.0)
Hemoglobin: 11.9 g/dL — ABNORMAL LOW (ref 12.0–15.0)
MCH: 28.7 pg (ref 26.0–34.0)
MCHC: 34.1 g/dL (ref 30.0–36.0)
MCV: 84.1 fL (ref 80.0–100.0)
Platelets: 224 10*3/uL (ref 150–400)
RBC: 4.15 MIL/uL (ref 3.87–5.11)
RDW: 12.9 % (ref 11.5–15.5)
WBC: 8.1 10*3/uL (ref 4.0–10.5)
nRBC: 0 % (ref 0.0–0.2)

## 2023-05-31 LAB — TROPONIN I (HIGH SENSITIVITY)
Troponin I (High Sensitivity): 7 ng/L (ref <18)
Troponin I (High Sensitivity): 7 ng/L (ref <18)

## 2023-05-31 LAB — COMPREHENSIVE METABOLIC PANEL
ALT: 18 U/L (ref 0–44)
AST: 25 U/L (ref 15–41)
Albumin: 3.7 g/dL (ref 3.5–5.0)
Alkaline Phosphatase: 106 U/L (ref 38–126)
Anion gap: 9 (ref 5–15)
BUN: 11 mg/dL (ref 6–20)
CO2: 24 mmol/L (ref 22–32)
Calcium: 9.1 mg/dL (ref 8.9–10.3)
Chloride: 105 mmol/L (ref 98–111)
Creatinine, Ser: 0.77 mg/dL (ref 0.44–1.00)
GFR, Estimated: >60 mL/min (ref >60)
Glucose, Bld: 145 mg/dL — ABNORMAL HIGH (ref 70–99)
Potassium: 3.8 mmol/L (ref 3.5–5.1)
Sodium: 138 mmol/L (ref 135–145)
Total Bilirubin: 0.9 mg/dL (ref 0.3–1.2)
Total Protein: 6.6 g/dL (ref 6.5–8.1)

## 2023-05-31 LAB — CREATININE, SERUM
Creatinine, Ser: 0.83 mg/dL (ref 0.44–1.00)
GFR, Estimated: >60 mL/min (ref >60)

## 2023-05-31 LAB — RAPID URINE DRUG SCREEN, HOSP PERFORMED
Amphetamines: POSITIVE — AB
Barbiturates: NOT DETECTED
Benzodiazepines: NOT DETECTED
Cocaine: NOT DETECTED
Opiates: NOT DETECTED
Tetrahydrocannabinol: POSITIVE — AB

## 2023-05-31 LAB — MAGNESIUM: Magnesium: 2.1 mg/dL (ref 1.7–2.4)

## 2023-05-31 LAB — BRAIN NATRIURETIC PEPTIDE: B Natriuretic Peptide: 88.7 pg/mL (ref 0.0–100.0)

## 2023-05-31 MED ORDER — AMIODARONE HCL IN DEXTROSE 360-4.14 MG/200ML-% IV SOLN
60.0000 mg/h | INTRAVENOUS | Status: DC
  Administered 2023-05-31: 60 mg/h via INTRAVENOUS
  Filled 2023-05-31: qty 200

## 2023-05-31 MED ORDER — SODIUM CHLORIDE 0.9 % IV BOLUS
1000.0000 mL | Freq: Once | INTRAVENOUS | Status: AC
  Administered 2023-05-31: 1000 mL via INTRAVENOUS

## 2023-05-31 MED ORDER — ALPRAZOLAM 0.5 MG PO TABS
0.5000 mg | ORAL_TABLET | Freq: Two times a day (BID) | ORAL | Status: DC | PRN
  Administered 2023-06-01 – 2023-06-03 (×5): 0.5 mg via ORAL
  Filled 2023-05-31 (×6): qty 1

## 2023-05-31 MED ORDER — ACETAMINOPHEN 325 MG PO TABS
650.0000 mg | ORAL_TABLET | ORAL | Status: DC | PRN
  Administered 2023-06-03: 650 mg via ORAL
  Filled 2023-05-31: qty 2

## 2023-05-31 MED ORDER — METOPROLOL TARTRATE 5 MG/5ML IV SOLN: 2.5000 mg | INTRAVENOUS | Status: DC

## 2023-05-31 MED ORDER — METOPROLOL TARTRATE 25 MG PO TABS: 25.0000 mg | ORAL_TABLET | Freq: Two times a day (BID) | ORAL | Status: DC

## 2023-05-31 MED ORDER — ONDANSETRON HCL 4 MG/2ML IJ SOLN
4.0000 mg | Freq: Four times a day (QID) | INTRAMUSCULAR | Status: DC | PRN
  Administered 2023-06-01: 4 mg via INTRAVENOUS
  Filled 2023-05-31: qty 2

## 2023-05-31 MED ORDER — METOPROLOL TARTRATE 5 MG/5ML IV SOLN: 2.5000 mg | INTRAVENOUS | Status: DC | PRN

## 2023-05-31 MED ORDER — AMIODARONE HCL IN DEXTROSE 360-4.14 MG/200ML-% IV SOLN: 30.0000 mg/h | INTRAVENOUS | Status: DC

## 2023-05-31 MED ORDER — HEPARIN SODIUM (PORCINE) 5000 UNIT/ML IJ SOLN
5000.0000 [IU] | Freq: Three times a day (TID) | INTRAMUSCULAR | Status: DC
  Administered 2023-05-31 – 2023-06-03 (×9): 5000 [IU] via SUBCUTANEOUS
  Filled 2023-05-31 (×10): qty 1

## 2023-05-31 NOTE — ED Notes (Signed)
ED TO INPATIENT HANDOFF REPORT  ED Nurse Name and Phone #: Einar Grad [phone]  S Name/Age/Gender Marissa Barnes 57 y.o. female Room/Bed: 032C/032C  Code Status   Code Status: Full Code  Home/SNF/Other Home Patient oriented to: self, place, time, and situation Is this baseline? Yes   Triage Complete: Triage complete  Chief Complaint Wide-complex tachycardia [R00.0]  Triage Note Patient arrives via EMS from cardiology office. Per EMS, patient was having an echo done when she went into vtach during the echo. Patient received 150mg  of amiodarone and 5mg  of lopressor. Per EMS, patient had the cardiology appointment due to having palpitations and feeling dizzy intermittently since Tuesday. Per EMS, patient did not lose pulses. Per EMS, upon their arrival, patient was in NSR. Patient is AAOx4; speaking in clear, full sentences.   Allergies No Known Allergies  Level of Care/Admitting Diagnosis ED Disposition     ED Disposition  Admit   Condition  --   Comment  Hospital Area: [facility] [[id number]]  Level of Care: Progressive [102]  Admit to Progressive based on following criteria: CARDIOVASCULAR & THORACIC of moderate stability with acute coronary syndrome symptoms/low risk myocardial infarction/hypertensive urgency/arrhythmias/heart failure potentially compromising stability and stable post cardiovascular intervention patients.  May admit patient to [facility] or [facility] if equivalent level of care is available:: No  Covid Evaluation: Asymptomatic - no recent exposure (last 10 days) testing not required  Diagnosis: Wide-complex tachycardia [962952]  Admitting Physician: Arlyss Gandy  Attending Physician: Marykay Lex [phone]  Certification:: I certify this patient will need inpatient services for at least 2 midnights  Expected Medical Readiness: 06/03/2023          B Medical/Surgery History Past Medical History:  Diagnosis Date   Anxiety     Depression    Past Surgical History:  Procedure Laterality Date   KNEE ARTHROSCOPY     LIPOSUCTION     TONSILECTOMY, ADENOIDECTOMY, BILATERAL MYRINGOTOMY AND TUBES  2010   TOTAL HIP ARTHROPLASTY Right 05/20/2019   Procedure: TOTAL HIP ARTHROPLASTY ANTERIOR APPROACH;  Surgeon: Ollen Gross, MD;  Location: [facility];  Service: Orthopedics;  Laterality: Right;    TUBAL LIGATION  2006     A IV Location/Drains/Wounds Patient Lines/Drains/Airways Status     Active Line/Drains/Airways     Name Placement date Placement time Site Days   Peripheral IV 05/31/23 22 G Left Antecubital 05/31/23  --  Antecubital  less than 1   Peripheral IV 05/31/23 20 G Right;Posterior Forearm 05/31/23  1543  Forearm  less than 1   Incision (Closed) 05/20/19 Hip Right 05/20/19  1403  -- 1472            Intake/Output Last 24 hours  Intake/Output Summary (Last 24 hours) at 05/31/2023 1808 Last data filed at 05/31/2023 1721 Gross per 24 hour  Intake 1053.7 ml  Output --  Net 1053.7 ml    Labs/Imaging Results for orders placed or performed during the hospital encounter of 05/31/23 (from the past 48 hour(s))  Troponin I (High Sensitivity)     Status: None   Collection Time: 05/31/23  3:02 PM  Result Value Ref Range   Troponin I (High Sensitivity) 7 <18 ng/L    Comment: (NOTE) Elevated high sensitivity troponin I (hsTnI) values and significant  changes across serial measurements may suggest ACS but many other  chronic and acute conditions are known to elevate hsTnI results.  Refer to the "Links" section for chest pain algorithms  and additional  guidance. Performed at [facility], [address], [place], [place] [address]   Brain natriuretic peptide     Status: None   Collection Time: 05/31/23  3:02 PM  Result Value Ref Range   B Natriuretic Peptide 88.7 0.0 - 100.0 pg/mL    Comment: Performed at [facility], [address] [place], [place] [address]  CBC with  Differential     Status: None   Collection Time: 05/31/23  3:02 PM  Result Value Ref Range   WBC 9.4 4.0 - 10.5 K/uL   RBC 5.02 3.87 - 5.11 MIL/uL   Hemoglobin 14.1 12.0 - 15.0 g/dL   HCT 02.7 25.3 - 66.4 %   MCV 83.7 80.0 - 100.0 fL   MCH 28.1 26.0 - 34.0 pg   MCHC 33.6 30.0 - 36.0 g/dL   RDW 40.3 47.4 - 25.9 %   Platelets 242 150 - 400 K/uL   nRBC 0.0 0.0 - 0.2 %   Neutrophils Relative % 82 %   Neutro Abs 7.7 1.7 - 7.7 K/uL   Lymphocytes Relative 14 %   Lymphs Abs 1.3 0.7 - 4.0 K/uL   Monocytes Relative 2 %   Monocytes Absolute 0.2 0.1 - 1.0 K/uL   Eosinophils Relative 1 %   Eosinophils Absolute 0.1 0.0 - 0.5 K/uL   Basophils Relative 0 %   Basophils Absolute 0.0 0.0 - 0.1 K/uL   Immature Granulocytes 1 %   Abs Immature Granulocytes 0.07 0.00 - 0.07 K/uL    Comment: Performed at [facility], [address], [place], [place] [address]  Comprehensive metabolic panel     Status: Abnormal   Collection Time: 05/31/23  3:02 PM  Result Value Ref Range   Sodium 138 135 - 145 mmol/L   Potassium 3.8 3.5 - 5.1 mmol/L   Chloride 105 98 - 111 mmol/L   CO2 24 22 - 32 mmol/L   Glucose, Bld 145 (H) 70 - 99 mg/dL    Comment: Glucose reference range applies only to samples taken after fasting for at least 8 hours.   BUN 11 6 - 20 mg/dL   Creatinine, Ser 5.64 0.44 - 1.00 mg/dL   Calcium 9.1 8.9 - 33.2 mg/dL   Total Protein 6.6 6.5 - 8.1 g/dL   Albumin 3.7 3.5 - 5.0 g/dL   AST 25 15 - 41 U/L   ALT 18 0 - 44 U/L   Alkaline Phosphatase 106 38 - 126 U/L   Total Bilirubin 0.9 0.3 - 1.2 mg/dL   GFR, Estimated >95 >18 mL/min    Comment: (NOTE) Calculated using the CKD-EPI Creatinine Equation (2021)    Anion gap 9 5 - 15    Comment: Performed at [facility], [address], [place], [place] [address]  Magnesium     Status: None   Collection Time: 05/31/23  3:02 PM  Result Value Ref Range   Magnesium 2.1 1.7 - 2.4 mg/dL    Comment: Performed at [facility], [address], [place], [place] [address]   DG Chest Port 1 View  Result Date: 05/31/2023 CLINICAL DATA:  Syncope. EXAM: PORTABLE CHEST 1 VIEW COMPARISON:  09/14/2009. FINDINGS: Bilateral lung fields are clear. Bilateral costophrenic angles are clear. Normal cardio-mediastinal silhouette. No acute osseous abnormalities. The soft tissues are within normal limits. IMPRESSION: 1. No active disease. Electronically Signed   By: Jules Schick M.D.   On: 05/31/2023 17:20  Pending Labs Unresulted Labs (From admission, onward)     Start     Ordered   05/31/23 1533  Urine rapid drug screen (hosp performed)  Once,   STAT        05/31/23 1532   05/31/23 1525  Urinalysis, w/ Reflex to Culture (Infection Suspected) -Urine, Clean Catch  Once,   URGENT       Question:  Specimen Source  Answer:  Urine, Clean Catch   05/31/23 1525   Signed and Held  TSH  Once,   R        Signed and Held   Signed and Held  Basic metabolic panel  Tomorrow morning,   R        Signed and Held   Signed and Held  CBC  (Pharmacologic VTE prophylaxis)  Once,   R       Comments: Baseline for heparin therapy IF NOT ALREADY DRAWN.  Notify MD if PLT < 100 K.    Signed and Held   Signed and Held  Creatinine, serum  (Pharmacologic VTE prophylaxis)  Once,   R       Comments: Baseline for heparin therapy IF NOT ALREADY DRAWN.    Signed and Held            Vitals/Pain Today's Vitals   05/31/23 1446 05/31/23 1545 05/31/23 1600 05/31/23 1645  BP: (!) 132/93 (!) 158/136 124/82 121/77  Pulse: 79 79 80 83  Resp: 18 (!) 21 17 20   Temp: 98.6 F (37 C)     TempSrc: Oral     SpO2: 97% 96% 99% 99%  Weight:      Height:      PainSc:        Isolation Precautions No active isolations  Medications Medications  sodium chloride 0.9 % bolus 1,000 mL (0 mLs Intravenous Stopped 05/31/23 1646)    Mobility walks     Focused Assessments Cardiac Assessment Handoff:  Cardiac Rhythm: Normal sinus rhythm No results found for:  "CKTOTAL", "CKMB", "CKMBINDEX", "TROPONINI" No results found for: "DDIMER" Does the Patient currently have chest pain? No    R Recommendations: See Admitting Provider Note  Report given to:   Additional Notes:

## 2023-05-31 NOTE — ED Provider Notes (Signed)
[place] EMERGENCY DEPARTMENT AT [facility] Provider Note  CSN: [id number] Arrival date & time: 05/31/23 1438  Chief Complaint(s) Tachycardia  HPI Marissa Barnes is a 57 y.o. female without significant past medical history presenting to the emergency department with near syncope.  The patient reports over the past week she has had multiple episodes of near syncope at rest, while at work, while in bed.  There is no clear trigger for these episodes.  She reports she had 1 episode of syncope as well.  Reports these are associated with palpitations.  No chest pain.  Today had some nausea but no vomiting.  No fevers or chills, leg swelling.  No abdominal pain or back pain.  No family history of similar episodes or sudden cardiac death.  Initially saw her primary doctor who referred her to cardiology.  She saw cardiology today.  After her appointment she had an echocardiogram.  Her echocardiogram patient was seen to be in possible ventricular tachycardia.  This corresponded with her symptoms.  She received 150 mg of amiodarone and 5 mg of metoprolol in the office with cessation of her symptoms.  She was then transported to the emergency department.  Past Medical History Past Medical History:  Diagnosis Date   Anxiety    Depression    Patient Active Problem List   Diagnosis Date Noted   Wide-complex tachycardia 05/31/2023   OA (osteoarthritis) of hip 05/20/2019   Home Medication(s) Prior to Admission medications   Medication Sig Start Date End Date Taking? Authorizing Provider  ALPRAZolam Prudy Feeler) 0.5 MG tablet Take 0.5 mg by mouth 2 (two) times daily as needed for anxiety.     [provider]  amphetamine-dextroamphetamine (ADDERALL) 30 MG tablet Take 30 mg by mouth 3 (three) times daily.     [provider]  HYDROcodone-acetaminophen (NORCO) 7.5-325 MG tablet Take 1-2 tablets by mouth every 6 (six) hours as needed for severe pain (pain score 7-10). 05/21/19    Cassandria Anger, PA-C  methocarbamol (ROBAXIN) 500 MG tablet Take 1 tablet (500 mg total) by mouth every 6 (six) hours as needed for muscle spasms. 05/21/19   Cassandria Anger, PA-C  naproxen (NAPROSYN) 500 MG tablet Take 500 mg by mouth 2 (two) times daily with a meal. 05/30/23   [provider]  PARoxetine (PAXIL) 40 MG tablet Take 40 mg by mouth daily.  09/02/18   [provider]  polyethylene glycol (MIRALAX / GLYCOLAX) 17 g packet Take 17 g by mouth daily.    [provider]                                                                                                                                    Past Surgical History Past Surgical History:  Procedure Laterality Date   KNEE ARTHROSCOPY     LIPOSUCTION     TONSILECTOMY, ADENOIDECTOMY, BILATERAL MYRINGOTOMY AND TUBES  2010  TOTAL HIP ARTHROPLASTY Right 05/20/2019   Procedure: TOTAL HIP ARTHROPLASTY ANTERIOR APPROACH;  Surgeon: Ollen Gross, MD;  Location: [facility];  Service: Orthopedics;  Laterality: Right;    TUBAL LIGATION  2006   Family History Family History  Problem Relation Age of Onset   Atrial fibrillation Mother    Diabetes Mother    Amblyopia Mother    Breast cancer Mother    Colon polyps Mother    Heart disease Father    Breast cancer Sister    Colon cancer Neg Hx    Esophageal cancer Neg Hx    Stomach cancer Neg Hx    Rectal cancer Neg Hx     Social History Social History   Tobacco Use   Smoking status: Never   Smokeless tobacco: Never  Vaping Use   Vaping status: Never Used  Substance Use Topics   Alcohol use: Yes    Comment: occ   Drug use: Yes    Frequency: 1.0 times per week    Types: Marijuana   Allergies Patient has no known allergies.  Review of Systems Review of Systems  All other systems reviewed and are negative.   Physical Exam Vital Signs  I have reviewed the triage vital signs BP (!) 114/100   Pulse 83   Temp 98.3 F (36.8 C) (Oral)    Resp 20   Ht 5\' 7"  (1.702 m)   Wt 76.2 kg   SpO2 99%   BMI 26.31 kg/m  Physical Exam Vitals and nursing note reviewed.  Constitutional:      General: She is not in acute distress.    Appearance: She is well-developed.  HENT:     Head: Normocephalic and atraumatic.     Mouth/Throat:     Mouth: Mucous membranes are moist.  Eyes:     Pupils: Pupils are equal, round, and reactive to light.  Cardiovascular:     Rate and Rhythm: Normal rate and regular rhythm.     Heart sounds: No murmur heard. Pulmonary:     Effort: Pulmonary effort is normal. No respiratory distress.     Breath sounds: Normal breath sounds.  Abdominal:     General: Abdomen is flat.     Palpations: Abdomen is soft.     Tenderness: There is no abdominal tenderness.  Musculoskeletal:        General: No tenderness.     Right lower leg: No edema.     Left lower leg: No edema.  Skin:    General: Skin is warm and dry.  Neurological:     General: No focal deficit present.     Mental Status: She is alert. Mental status is at baseline.  Psychiatric:        Mood and Affect: Mood normal.        Behavior: Behavior normal.     ED Results and Treatments Labs (all labs ordered are listed, but only abnormal results are displayed) Labs Reviewed  COMPREHENSIVE METABOLIC PANEL - Abnormal; Notable for the following components:      Result Value   Glucose, Bld 145 (*)    All other components within normal limits  BRAIN NATRIURETIC PEPTIDE  CBC WITH DIFFERENTIAL/PLATELET  MAGNESIUM  URINALYSIS, W/ REFLEX TO CULTURE (INFECTION SUSPECTED)  RAPID URINE DRUG SCREEN, HOSP PERFORMED  I-STAT CHEM 8, ED  TROPONIN I (HIGH SENSITIVITY)  TROPONIN I (HIGH SENSITIVITY)  Radiology DG Chest Port 1 View  Result Date: 05/31/2023 CLINICAL DATA:  Syncope. EXAM: PORTABLE CHEST 1 VIEW COMPARISON:  09/14/2009.  FINDINGS: Bilateral lung fields are clear. Bilateral costophrenic angles are clear. Normal cardio-mediastinal silhouette. No acute osseous abnormalities. The soft tissues are within normal limits. IMPRESSION: 1. No active disease. Electronically Signed   By: Jules Schick M.D.   On: 05/31/2023 17:20    Pertinent labs & imaging results that were available during my care of the patient were reviewed by me and considered in my medical decision making (see MDM for details).  Medications Ordered in ED Medications  sodium chloride 0.9 % bolus 1,000 mL (0 mLs Intravenous Stopped 05/31/23 1646)                                                                                                                                     Procedures .Critical Care  Performed by: Lonell Grandchild, MD Authorized by: Lonell Grandchild, MD   Critical care provider statement:    Critical care time (minutes):  30   Critical care was necessary to treat or prevent imminent or life-threatening deterioration of the following conditions:  Cardiac failure   Critical care was time spent personally by me on the following activities:  Development of treatment plan with patient or surrogate, discussions with consultants, evaluation of patient's response to treatment, examination of patient, ordering and review of laboratory studies, ordering and review of radiographic studies, ordering and performing treatments and interventions, pulse oximetry, re-evaluation of patient's condition and review of old charts   Care discussed with: admitting provider     (including critical care time)  Medical Decision Making / ED Course   MDM:  57 year old female presenting to the emergency department with near syncopal event today, but to be related to ventricular tachycardia.  Patient overall well-appearing, physical exam is unremarkable.  Currently normal sinus rhythm.  EKG is unremarkable.  Given witnessed possible ventricular  tachycardia during echocardiogram, patient will need to be admitted for further monitoring.  Likely to cardiology.  Will check labs to evaluate for electrolyte disturbance, elevated troponin,.  She has no clinical signs of CHF exacerbation.  Unclear cause of her symptoms.  She received an amiodarone bolus.  Will complete amiodarone infusion.      Additional history obtained: -Additional history obtained from ems -External records from outside source obtained and reviewed including: Chart review including previous notes, labs, imaging, consultation notes including cardiology note   Lab Tests: -I ordered, reviewed, and interpreted labs.   The pertinent results include:   Labs Reviewed  COMPREHENSIVE METABOLIC PANEL - Abnormal; Notable for the following components:      Result Value   Glucose, Bld 145 (*)    All other components within normal limits  BRAIN NATRIURETIC PEPTIDE  CBC WITH DIFFERENTIAL/PLATELET  MAGNESIUM  URINALYSIS, W/ REFLEX TO CULTURE (INFECTION SUSPECTED)  RAPID URINE DRUG SCREEN, HOSP PERFORMED  I-STAT CHEM 8, ED  TROPONIN I (HIGH SENSITIVITY)  TROPONIN I (HIGH SENSITIVITY)    Notable for normal troponin, BNP  EKG   EKG Interpretation Date/Time:  Friday May 31 2023 14:48:23 EDT Ventricular Rate:  77 PR Interval:  143 QRS Duration:  90 QT Interval:  379 QTC Calculation: 429 R Axis:   28  Text Interpretation: Sinus rhythm Low voltage, precordial leads Anteroseptal infarct, old Confirmed by Alvino Blood ([id number]) on 05/31/2023 3:12:00 PM         Imaging Studies ordered: I ordered imaging studies including CXR On my interpretation imaging demonstrates no acute process I independently visualized and interpreted imaging. I agree with the radiologist interpretation   Medicines ordered and prescription drug management: Meds ordered this encounter  Medications   sodium chloride 0.9 % bolus 1,000 mL   DISCONTD: amiodarone (NEXTERONE PREMIX)  360-4.14 MG/200ML-% (1.8 mg/mL) IV infusion   DISCONTD: amiodarone (NEXTERONE PREMIX) 360-4.14 MG/200ML-% (1.8 mg/mL) IV infusion    -I have reviewed the patients home medicines and have made adjustments as needed   Consultations Obtained: I requested consultation with the cardiology,  and discussed lab and imaging findings as well as pertinent plan - they recommend: admission   Cardiac Monitoring: The patient was maintained on a cardiac monitor.  I personally viewed and interpreted the cardiac monitored which showed an underlying rhythm of: NSR   Reevaluation: After the interventions noted above, I reevaluated the patient and found that their symptoms have resolved  Co morbidities that complicate the patient evaluation  Past Medical History:  Diagnosis Date   Anxiety    Depression       Dispostion: Disposition decision including need for hospitalization was considered, and patient admitted to the hospital.    Final Clinical Impression(s) / ED Diagnoses Final diagnoses:  Ventricular tachycardia (HCC)     This chart was dictated using voice recognition software.  Despite best efforts to proofread,  errors can occur which can change the documentation meaning.    Lonell Grandchild, MD 05/31/23 [phone]

## 2023-05-31 NOTE — ED Triage Notes (Addendum)
Patient arrives via EMS from cardiology office. Per EMS, patient was having an echo done when she went into vtach during the echo. Patient received 150mg  of amiodarone and 5mg  of lopressor. Per EMS, patient had the cardiology appointment due to having palpitations and feeling dizzy intermittently since Tuesday. Per EMS, patient did not lose pulses. Per EMS, upon their arrival, patient was in NSR. Patient is AAOx4; speaking in clear, full sentences.

## 2023-05-31 NOTE — H&P (Addendum)
Cardiology Admission History and Physical   Patient ID: SHALUNDA LINDH MRN: [id number]; DOB: 1965/10/17   Admission date: 05/31/2023  PCP:  [facility]   [facility] Providers Cardiologist: Dr. Fawn Kirk   Chief Complaint:  recurrent dizziness and single syncope  Patient Profile:   Marissa Barnes is a 57 y.o. female with HLD, depression, PCOS and ADHD who is being seen 05/31/2023 for the evaluation of wide-complex tachycardia and recurrent dizziness.  History of Present Illness:   Marissa Barnes is a 57 yo female with PMH of HLD, depression, PCOS and ADHD.  Patient herself has no prior cardiac history.  Mother has history of A-fib.  Father had a history of heart stent.  She denies any family history of sudden cardiac death.  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  She currently works as a Naval architect.  She carry heavy load and stand on her feet for long period of time.  She denies any recent exertional chest pain or worsening dyspnea.  She was in her usual state of health until Tuesday 05/30/2023 when she had sudden onset of dizziness lasting only for few seconds at work.  After she got home, she had more episode of dizzy spell.  While at work on Wednesday, she had a syncopal episode that lasted a few seconds.  She saw her PCP on Thursday and who referred her urgently to a cardiologist.  During the meantime, she had morning events of dizziness and tunnel vision at work and at home.  She was eventually seen by [facility] cardiology service this morning.  Her symptom was concerning for arrhythmogenic events.  A stat echocardiogram and ZIO monitor was ordered along with lab work.  While she was undergoing echocardiogram, she went into multiple episode of ventricular tachycardia.  Unfortunately no strip was obtained.  Her echocardiogram was unable to be  completed due to reported hemodynamic instability.  She was given 5 mg IV Lopressor and 150 mg IV amiodarone and sent to [facility], ED for further evaluation.  While at [facility], ED, patient was placed on amiodarone drip.  She did not have any recurrent ventricular ectopy in the emergency room.  Cardiology service consulted for wide-complex tachycardia.  Again unfortunately no strips was obtained.  EMS reportedly took a photo of her echo image along with the wide-complex strep, however this was not given to ED physician.   Past Medical History:  Diagnosis Date   Anxiety    Depression     Past Surgical History:  Procedure Laterality Date   KNEE ARTHROSCOPY     LIPOSUCTION     TONSILECTOMY, ADENOIDECTOMY, BILATERAL MYRINGOTOMY AND TUBES  2010   TOTAL HIP ARTHROPLASTY Right 05/20/2019   Procedure: TOTAL HIP ARTHROPLASTY ANTERIOR APPROACH;  Surgeon: Ollen Gross, MD;  Location: [facility];  Service: Orthopedics;  Laterality: Right;    TUBAL LIGATION  2006     Medications Prior to Admission: Prior to Admission medications   Medication Sig Start Date End Date Taking? Authorizing Provider  ALPRAZolam Prudy Feeler) 0.5 MG tablet Take 0.5 mg by mouth 2 (two) times daily as needed for anxiety.     [provider]  amphetamine-dextroamphetamine (ADDERALL) 30 MG tablet Take 30 mg by mouth 3 (three) times daily.     [provider]  HYDROcodone-acetaminophen (NORCO) 7.5-325 MG  tablet Take 1-2 tablets by mouth every 6 (six) hours as needed for severe pain (pain score 7-10). 05/21/19   Cassandria Anger, PA-C  methocarbamol (ROBAXIN) 500 MG tablet Take 1 tablet (500 mg total) by mouth every 6 (six) hours as needed for muscle spasms. 05/21/19   Cassandria Anger, PA-C  PARoxetine (PAXIL) 40 MG tablet Take 40 mg by mouth daily.  09/02/18   [provider]  polyethylene glycol (MIRALAX / GLYCOLAX) 17 g packet Take 17 g by mouth daily.    [provider]     Allergies:    No Known Allergies  Social History:   Social History   Socioeconomic History   Marital status: Divorced    Spouse name: Not on file   Number of children: Not on file   Years of education: Not on file   Highest education level: Not on file  Occupational History   Not on file  Tobacco Use   Smoking status: Never   Smokeless tobacco: Never  Vaping Use   Vaping status: Never Used  Substance and Sexual Activity   Alcohol use: Yes    Comment: occ   Drug use: Yes    Frequency: 1.0 times per week    Types: Marijuana   Sexual activity: Not Currently    Birth control/protection: Surgical  Other Topics Concern   Not on file  Social History Narrative   Not on file   Social Determinants of Health   Financial Resource Strain: Medium Risk (05/29/2023)   Received from [facility]   Overall Financial Resource Strain (CARDIA)    Difficulty of Paying Living Expenses: Somewhat hard  Food Insecurity: Food Insecurity Present (05/29/2023)   Received from [facility]   Hunger Vital Sign    Worried About Running Out of Food in the Last Year: Often true    Ran Out of Food in the Last Year: Often true  Transportation Needs: No Transportation Needs (05/29/2023)   Received from [facility] - Transportation    Lack of Transportation (Medical): No    Lack of Transportation (Non-Medical): No  Physical Activity: Sufficiently Active (05/29/2023)   Received from [facility]   Exercise Vital Sign    Days of Exercise per Week: 5 days    Minutes of Exercise per Session: 150+ min  Stress: No Stress Concern Present (05/29/2023)   Received from [facility] - Occupational Stress Questionnaire    Feeling of Stress : Only a little  Social Connections: Moderately Integrated (05/29/2023)   Received from [facility]   Social Network    How would you rate your social network (family, work, friends)?: Adequate participation with social networks   Intimate Partner Violence: Not At Risk (05/29/2023)   Received from [facility]   HITS    Over the last 12 months how often did your partner physically hurt you?: 1    Over the last 12 months how often did your partner insult you or talk down to you?: 1    Over the last 12 months how often did your partner threaten you with physical harm?: 1    Over the last 12 months how often did your partner scream or curse at you?: 1    Family History:   The patient's family history includes Amblyopia in her mother; Atrial fibrillation in her mother; Breast cancer in her mother and sister; Colon polyps in her mother; Diabetes in her mother;  Heart disease in her father. There is no history of Colon cancer, Esophageal cancer, Stomach cancer, or Rectal cancer.    ROS:  Please see the history of present illness.  All other ROS reviewed and negative.     Physical Exam/Data:   Vitals:   05/31/23 1446 05/31/23 1545 05/31/23 1600 05/31/23 1645  BP: (!) 132/93 (!) 158/136 124/82 121/77  Pulse: 79 79 80 83  Resp: 18 (!) 21 17 20   Temp: 98.6 F (37 C)     TempSrc: Oral     SpO2: 97% 96% 99% 99%  Weight:      Height:        Intake/Output Summary (Last 24 hours) at 05/31/2023 1654 Last data filed at 05/31/2023 1646 Gross per 24 hour  Intake 1002.23 ml  Output --  Net 1002.23 ml      05/31/2023    2:43 PM 05/20/2019   11:55 AM 05/14/2019    1:38 PM  Last 3 Weights  Weight (lbs) 168 lb 155 lb 155 lb  Weight (kg) 76.204 kg 70.308 kg 70.308 kg     Body mass index is 26.31 kg/m.  General:  Well nourished, well developed, in no acute distress HEENT: normal Neck: no JVD Vascular: No carotid bruits; Distal pulses 2+ bilaterally   Cardiac:  normal S1, S2; RRR; no murmur  Lungs:  clear to auscultation bilaterally, no wheezing, rhonchi or rales  Abd: soft, nontender, no hepatomegaly  Ext: no edema Musculoskeletal:  No deformities, BUE and BLE strength normal and equal Skin: warm and dry  Neuro:   CNs 2-12 intact, no focal abnormalities noted Psych:  Normal affect    EKG:  The ECG that was done and was personally reviewed and demonstrates normal sinus rhythm, no significant ST-T wave changes  Relevant CV Studies:  N/A  Laboratory Data:  High Sensitivity Troponin:   Recent Labs  Lab 05/31/23 1502  TROPONINIHS 7      Chemistry Recent Labs  Lab 05/31/23 1502  NA 138  K 3.8  CL 105  CO2 24  GLUCOSE 145*  BUN 11  CREATININE 0.77  CALCIUM 9.1  MG 2.1  GFRNONAA >60  ANIONGAP 9    Recent Labs  Lab 05/31/23 1502  PROT 6.6  ALBUMIN 3.7  AST 25  ALT 18  ALKPHOS 106  BILITOT 0.9   Lipids No results for input(s): "CHOL", "TRIG", "HDL", "LABVLDL", "LDLCALC", "CHOLHDL" in the last 168 hours. Hematology Recent Labs  Lab 05/31/23 1502  WBC 9.4  RBC 5.02  HGB 14.1  HCT 42.0  MCV 83.7  MCH 28.1  MCHC 33.6  RDW 12.9  PLT 242   Thyroid No results for input(s): "TSH", "FREET4" in the last 168 hours. BNP Recent Labs  Lab 05/31/23 1502  BNP 88.7    DDimer No results for input(s): "DDIMER" in the last 168 hours.   Radiology/Studies:  No results found.   Assessment and Plan:   Recurrent dizziness with syncope  -Symptoms started on Tuesday.  No reported history of sudden cardiac death in the family.  She had multiple episodes of dizziness and a single episode of syncope at work on Wednesday.  She works as a Naval architect and does strenuous activity without any exertional symptoms.  She was seen by [facility] cardiologist in the office this morning who ordered lab work, stat echocardiogram and a ZIO monitor.  While she was receiving the stat echocardiogram, she reportedly had multiple episodes of wide-complex tachycardia concerning for VT.  Echocardiogram was unable to be completed.  She was given 5 mg IV Lopressor and 150 mg IV amiodarone followed by amiodarone drip and was sent to [facility], ED for further evaluation.  -While in the ED, she was continued  on IV amiodarone, no ventricular ectopy was seen on telemetry.  -Discussed with MD, will discontinue amiodarone to prevent unmasking of underlying rhythm.  Will admit the patient to progressive floor.  Serial trop and TSH.  Obtain echocardiogram.  Will have EP to see the patient tomorrow, may consider cardiac MRI and the EP study at a later time  Wide-complex tachycardia: Likely contributed to the recurrent dizzy spell.  Noted by primary cardiologist during stat echocardiogram this morning.  Unfortunately no strips was sent with the patient to the ED.  Will stop IV amiodarone for now.  Patient's systolic blood pressures 110s.  History of ADHD: On Adderall, hold Adderall for now  History of depression/anxiety: On Xanax and the Paxil   Risk Assessment/Risk Scores:           Code Status: Full Code  Severity of Illness: The appropriate patient status for this patient is INPATIENT. Inpatient status is judged to be reasonable and necessary in order to provide the required intensity of service to ensure the patient's safety. The patient's presenting symptoms, physical exam findings, and initial radiographic and laboratory data in the context of their chronic comorbidities is felt to place them at high risk for further clinical deterioration. Furthermore, it is not anticipated that the patient will be medically stable for discharge from the hospital within 2 midnights of admission.   * I certify that at the point of admission it is my clinical judgment that the patient will require inpatient hospital care spanning beyond 2 midnights from the point of admission due to high intensity of service, high risk for further deterioration and high frequency of surveillance required.*   For questions or updates, please contact [facility] Please consult [email or web address] for contact info under     Signed, Azalee Course, PA  05/31/2023 4:54 PM   ATTENDING ATTESTATION  I have seen, examined and  evaluated the patient this evening in the ER along with Azalee Course..  After reviewing all the available data and chart, we discussed the patients laboratory, study & physical findings as well as symptoms in detail.  I agree with his findings, examination as well as impression recommendations as per our discussion.    Difficult scenario as the patient presented here already on the IV amiodarone drip for presumed wide-complex tachycardia which we do not have the strips for.  She has had 1 syncopal episode and several episodes of dizziness.  I do not think this is life threatening at this time and would like to see if we could have the rhythm be reproduced on our telemetry.  I am therefore going to stop the IV amiodarone to see if we can see anything on telemetry.  I have asked that the patient be rounded on by our electrophysiologist over the weekend.  Perhaps she would be a candidate for electrophysiology study or ILR since she is now had recurrent episodes like concerning arrhythmia.  Question is it was V. tach or potential aberrantly conducted SVT or A-fib.  Apparently her mother had some type of arrhythmia and was post to have some, procedure to treated where there is some complication that she eventually died as a result of this procedure.  Unfortunately she does not know the history but perhaps  her sister could provides more information.  They can be investigated over the weekend.  Plan will be to admit monitor on telemetry,, stop amiodarone, check 2D echo.  Depending on findings may consider MRI.    Marykay Lex, MD, MS Bryan Lemma, M.D., M.S. Interventional Cardiologist  [facility]  Pager # [phone] Phone # [phone] [address] [place], [place] [address]

## 2023-05-31 NOTE — ED Notes (Signed)
Defibrillator pads placed on patient; code cart and defibrillator at bedside.

## 2023-06-01 ENCOUNTER — Inpatient Hospital Stay (HOSPITAL_COMMUNITY): Payer: Medicaid Other

## 2023-06-01 DIAGNOSIS — I472 Ventricular tachycardia, unspecified: Secondary | ICD-10-CM

## 2023-06-01 DIAGNOSIS — F32A Depression, unspecified: Secondary | ICD-10-CM

## 2023-06-01 DIAGNOSIS — E782 Mixed hyperlipidemia: Secondary | ICD-10-CM

## 2023-06-01 DIAGNOSIS — F909 Attention-deficit hyperactivity disorder, unspecified type: Secondary | ICD-10-CM | POA: Diagnosis not present

## 2023-06-01 DIAGNOSIS — R55 Syncope and collapse: Secondary | ICD-10-CM

## 2023-06-01 DIAGNOSIS — I493 Ventricular premature depolarization: Secondary | ICD-10-CM | POA: Diagnosis not present

## 2023-06-01 DIAGNOSIS — I4729 Other ventricular tachycardia: Secondary | ICD-10-CM

## 2023-06-01 DIAGNOSIS — R000 Tachycardia, unspecified: Secondary | ICD-10-CM | POA: Diagnosis not present

## 2023-06-01 LAB — BASIC METABOLIC PANEL
Anion gap: 7 (ref 5–15)
BUN: 9 mg/dL (ref 6–20)
CO2: 24 mmol/L (ref 22–32)
Calcium: 8.5 mg/dL — ABNORMAL LOW (ref 8.9–10.3)
Chloride: 109 mmol/L (ref 98–111)
Creatinine, Ser: 0.72 mg/dL (ref 0.44–1.00)
GFR, Estimated: >60 mL/min (ref >60)
Glucose, Bld: 115 mg/dL — ABNORMAL HIGH (ref 70–99)
Potassium: 3.7 mmol/L (ref 3.5–5.1)
Sodium: 140 mmol/L (ref 135–145)

## 2023-06-01 LAB — ECHOCARDIOGRAM COMPLETE
AR max vel: 1.53 cm2
AV Area VTI: 1.51 cm2
AV Area mean vel: 1.4 cm2
AV Mean grad: 5 mm[Hg]
AV Peak grad: 8.2 mm[Hg]
Ao pk vel: 1.43 m/s
Area-P 1/2: 3.53 cm2
Calc EF: 69.6 %
Height: 67 in
S' Lateral: 3 cm
Single Plane A2C EF: 69.6 %
Single Plane A4C EF: 71.5 %
Weight: 2853.63 [oz_av]

## 2023-06-01 LAB — TSH: TSH: 4.05 u[IU]/mL (ref 0.350–4.500)

## 2023-06-01 MED ORDER — DILTIAZEM HCL 30 MG PO TABS
30.0000 mg | ORAL_TABLET | Freq: Three times a day (TID) | ORAL | Status: DC
  Administered 2023-06-01 (×2): 30 mg via ORAL
  Filled 2023-06-01 (×2): qty 1

## 2023-06-01 MED ORDER — PAROXETINE HCL 20 MG PO TABS
40.0000 mg | ORAL_TABLET | Freq: Every day | ORAL | Status: DC
  Administered 2023-06-02 – 2023-06-04 (×3): 40 mg via ORAL
  Filled 2023-06-01 (×5): qty 2

## 2023-06-01 MED ORDER — DILTIAZEM HCL 30 MG PO TABS: 30.0000 mg | ORAL_TABLET | Freq: Three times a day (TID) | ORAL | Status: DC

## 2023-06-01 MED ORDER — POTASSIUM CHLORIDE CRYS ER 20 MEQ PO TBCR
20.0000 meq | EXTENDED_RELEASE_TABLET | Freq: Two times a day (BID) | ORAL | Status: AC
  Administered 2023-06-01 (×2): 20 meq via ORAL
  Filled 2023-06-01 (×2): qty 1

## 2023-06-01 MED ORDER — PERFLUTREN LIPID MICROSPHERE
1.0000 mL | INTRAVENOUS | Status: AC | PRN
  Administered 2023-06-01: 3 mL via INTRAVENOUS

## 2023-06-01 NOTE — Consult Note (Signed)
   Electrophysiology Consultation   Patient ID: CLINT STRUPP MRN: [id number]; DOB: Mar 15, 1966  Admit date: 05/31/2023 Date of Consult: 06/01/2023  PCP:  [facility]   History of Present Illness:   Ms. Fuelling is a 57 year old woman with a history of hyperlipidemia, prior heavy alcohol use, depression, PCOS, ADHD who I am seeing today for an evaluation of ventricular tachycardia at the request of Dr. Herbie Baltimore.  She has no prior cardiac history.  Family history significant for atrial fibrillation in her mother, coronary artery disease in her father.  Starting on Tuesday, October 10, she began experiencing salvos of dizziness lasting seconds at a time.  On Wednesday she had a syncopal episode lasting a few seconds.  She was seen by her primary care physician who made an urgent referral to [facility] cardiology.  While undergoing an echocardiogram, patient experienced wide-complex tachycardia and was urgently sent to [facility].  She was started on an amiodarone drip and her arrhythmia is quiesced.  No prior history of cardiac arrhythmia.  No recent changes in medication.  No recent illnesses.  When she arrived at [facility] her amiodarone was stopped and she was placed on telemetry.  Overnight she is experienced several salvos of rapid, wide-complex tachycardia.  Past medical, surgical, social and family history reviewed.  ROS:  Please see the history of present illness.  All other ROS reviewed and negative.     Physical Exam/Data:   Vitals:   05/31/23 2030 05/31/23 2326 06/01/23 0514 06/01/23 0815  BP: 121/66 126/84 117/72 111/68  Pulse: 74 70 79 (!) 48  Resp: 18 18 19 17   Temp: 98.7 F (37.1 C) 98.6 F (37 C) 98.4 F (36.9 C) 98.2 F (36.8 C)  TempSrc: Oral Oral Oral Oral  SpO2: 100% 100% 98% 98%  Weight:   80.9 kg   Height:        General:  Well nourished, well developed, in no acute distress Cardiac:  normal S1, S2; RRR; no murmur  Lungs:  clear to  auscultation bilaterally, no wheezing, rhonchi or rales  Psych:  Normal affect   EKG:  The EKG was personally reviewed and demonstrates: Sinus rhythm.  No preexcitation.  Intervals normal. Repeat ECG this AM shows sinus with frequent PVC. PVC have steep inferior axis, QS in aVR/aVL. Precordial transition in V4/V5.  Telemetry:  Telemetry was personally reviewed and demonstrates: Sinus rhythm.  Salvos of NSVT with ventricular rates around 250 bpm    Assessment and Plan:   Ms. Bills is a 57 year old woman admitted with symptomatic NSVT and syncope.  Rapid nonsustained ventricular tachycardia has been observed on telemetry.  #Ventricular tachycardia Unclear cause at this point.  Appears to be outflow tract origin. Need to assess for structural abnormalities of the heart, inflammation, infiltration. - echo today.   - cardiac MRI ordered - If ejection fraction is normal, plan to start diltiazem every 8 hours and uptitrate as tolerated - Keep K greater than 4, mag greater than 2  Dainelle Hun T. Lalla Brothers, MD, [facility], [facility] Cardiac Electrophysiology

## 2023-06-01 NOTE — Progress Notes (Signed)
Rounding Note    Patient Name: Marissa Barnes Date of Encounter: 06/01/2023  [facility] Cardiologist: None  Subjective   Resting in bed comfortably. Asymptomatic. Accompanied by her daughters.  Inpatient Medications    Scheduled Meds:  diltiazem  30 mg Oral TID   heparin  5,000 Units Subcutaneous Q8H   potassium chloride  20 mEq Oral BID   Continuous Infusions:  PRN Meds: acetaminophen, ALPRAZolam, ondansetron (ZOFRAN) IV   Vital Signs    Vitals:   06/01/23 0514 06/01/23 0815 06/01/23 1159 06/01/23 1606  BP: 117/72 111/68 115/80   Pulse: 79 (!) 48 (!) 40 (!) 51  Resp: 19 17 17 19   Temp: 98.4 F (36.9 C) 98.2 F (36.8 C) 97.9 F (36.6 C) 98.3 F (36.8 C)  TempSrc: Oral Oral Oral Oral  SpO2: 98% 98% 97% 100%  Weight: 80.9 kg     Height:        Intake/Output Summary (Last 24 hours) at 06/01/2023 1801 Last data filed at 06/01/2023 1754 Gross per 24 hour  Intake 360 ml  Output 600 ml  Net -240 ml      06/01/2023    5:14 AM 05/31/2023    8:00 PM 05/31/2023    2:43 PM  Last 3 Weights  Weight (lbs) 178 lb 5.6 oz 168 lb 8.4 oz 168 lb  Weight (kg) 80.9 kg 76.443 kg 76.204 kg      Telemetry    Predominantly normal sinus rhythm with salvos of NSVT- Personally Reviewed  ECG    06/01/2023: Sinus rhythm with PVCs and ventricular bigeminy pattern - Personally Reviewed  Physical Exam  Vital signs as noted above Physical Exam  Constitutional: No distress.  hemodynamically stable  Neck: No JVD present.  Cardiovascular: Normal rate, regular rhythm, S1 normal, S2 normal, intact distal pulses and normal pulses. Exam reveals no gallop, no S3 and no S4.  No murmur heard. Pulmonary/Chest: Effort normal and breath sounds normal. No stridor. She has no wheezes. She has no rales.  Abdominal: Soft. Bowel sounds are normal. She exhibits no distension. There is no abdominal tenderness.  Musculoskeletal:        General: No edema.     Cervical back:  Neck supple.  Neurological: She is alert and oriented to person, place, and time. She has intact cranial nerves (2-12).  Skin: Skin is warm and moist.   Labs    High Sensitivity Troponin:   Recent Labs  Lab 05/31/23 1502 05/31/23 1725  TROPONINIHS 7 7     Chemistry Recent Labs  Lab 05/31/23 1502 05/31/23 2256 06/01/23 0337  NA 138  --  140  K 3.8  --  3.7  CL 105  --  109  CO2 24  --  24  GLUCOSE 145*  --  115*  BUN 11  --  9  CREATININE 0.77 0.83 0.72  CALCIUM 9.1  --  8.5*  MG 2.1  --   --   PROT 6.6  --   --   ALBUMIN 3.7  --   --   AST 25  --   --   ALT 18  --   --   ALKPHOS 106  --   --   BILITOT 0.9  --   --   GFRNONAA >60 >60 >60  ANIONGAP 9  --  7    Lipids No results for input(s): "CHOL", "TRIG", "HDL", "LABVLDL", "LDLCALC", "CHOLHDL" in the last 168 hours.  Hematology Recent Labs  Lab 05/31/23 1502 05/31/23 2256  WBC 9.4 8.1  RBC 5.02 4.15  HGB 14.1 11.9*  HCT 42.0 34.9*  MCV 83.7 84.1  MCH 28.1 28.7  MCHC 33.6 34.1  RDW 12.9 12.9  PLT 242 224   Thyroid  Recent Labs  Lab 05/31/23 2256  TSH 4.050    BNP Recent Labs  Lab 05/31/23 1502  BNP 88.7    DDimer No results for input(s): "DDIMER" in the last 168 hours.   Radiology    CXR  05/31/2023: No active process.   Cardiac Studies   Echo: Pending  CMRI: Ordered.   Patient Profile     57 y.o. female with HLD, depression, PCOS and ADHD who is being seen 05/31/2023 for the evaluation of wide-complex tachycardia and recurrent dizziness.   Assessment & Plan    Wide-complex tachycardia -nonsustained ventricular tachycardia versus SVT with aberrancy Premature ventricular contractions. Syncope/near-syncope. Presented from her primary cardiologist's office when she was noted to have wide-complex tachycardia on single-lead strip while getting the echocardiogram. No formal strips available for review She was initially placed on amiodarone and later discontinued/held during this  hospitalization to evaluate for underlying arrhythmia Telemetry as noted underlying rhythm to be sinus with frequent PVCs and salvos of NSVT EKG this morning notes a sinus rhythm with frequent ventricular ectopy.  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 EP consulted Echocardiogram formal read is pending.  Preliminary LVEF is within normal limits. Cardiac MRI ordered Started on Cardizem 30 mg p.o. 3 times daily Will continue to monitor Start potassium 20 mEq p.o. twice daily. Will check potassium and magnesium daily. I have advised her to reduce the intake of soda (had a 2 L pop bottle at bedside) No identifiable reversible cause She will need guidance with regards to driving prior to discharge.  ADHD.  Hold outpatient medication  Depression: Hold antidepressant  Hyperlipidemia: Will check fasting lipids.  Currently not on statin therapy.  For questions or updates, please contact [facility] Please consult [email or web address] for contact info under     Signed, Tessa Lerner, DO, [facility]  [facility]  [address] [place], [place] [address] Pager: [phone] Office: [phone] 06/01/2023, 6:01 PM

## 2023-06-01 NOTE — Progress Notes (Signed)
Sent secure to MD to make him aware of pt having runs on Vtach. I also completed an EKG. Pt is having same symptoms that she had prior to admission.

## 2023-06-01 NOTE — Progress Notes (Signed)
I messaged the MD through secure chat to inquire about home medication and bathroom privileges

## 2023-06-01 NOTE — Progress Notes (Signed)
Echocardiogram 2D Echocardiogram has been performed.  Marissa Barnes N Kaydense Rizo,RDCS 06/01/2023, 2:12 PM

## 2023-06-01 NOTE — Plan of Care (Signed)

## 2023-06-01 NOTE — Progress Notes (Signed)
MD had rounded at the bedside to assess patient and placed orders

## 2023-06-02 DIAGNOSIS — I472 Ventricular tachycardia, unspecified: Secondary | ICD-10-CM | POA: Diagnosis not present

## 2023-06-02 DIAGNOSIS — E781 Pure hyperglyceridemia: Secondary | ICD-10-CM

## 2023-06-02 DIAGNOSIS — F909 Attention-deficit hyperactivity disorder, unspecified type: Secondary | ICD-10-CM | POA: Diagnosis not present

## 2023-06-02 DIAGNOSIS — R55 Syncope and collapse: Secondary | ICD-10-CM | POA: Diagnosis not present

## 2023-06-02 DIAGNOSIS — I493 Ventricular premature depolarization: Secondary | ICD-10-CM | POA: Diagnosis not present

## 2023-06-02 LAB — CBC
HCT: 34.4 % — ABNORMAL LOW (ref 36.0–46.0)
Hemoglobin: 11.6 g/dL — ABNORMAL LOW (ref 12.0–15.0)
MCH: 28.4 pg (ref 26.0–34.0)
MCHC: 33.7 g/dL (ref 30.0–36.0)
MCV: 84.1 fL (ref 80.0–100.0)
Platelets: 197 10*3/uL (ref 150–400)
RBC: 4.09 MIL/uL (ref 3.87–5.11)
RDW: 12.9 % (ref 11.5–15.5)
WBC: 7 10*3/uL (ref 4.0–10.5)
nRBC: 0 % (ref 0.0–0.2)

## 2023-06-02 LAB — BASIC METABOLIC PANEL
Anion gap: 8 (ref 5–15)
BUN: 10 mg/dL (ref 6–20)
CO2: 23 mmol/L (ref 22–32)
Calcium: 8.7 mg/dL — ABNORMAL LOW (ref 8.9–10.3)
Chloride: 111 mmol/L (ref 98–111)
Creatinine, Ser: 0.79 mg/dL (ref 0.44–1.00)
GFR, Estimated: >60 mL/min (ref >60)
Glucose, Bld: 131 mg/dL — ABNORMAL HIGH (ref 70–99)
Potassium: 4.2 mmol/L (ref 3.5–5.1)
Sodium: 142 mmol/L (ref 135–145)

## 2023-06-02 LAB — LIPID PANEL
Cholesterol: 201 mg/dL — ABNORMAL HIGH (ref 0–200)
HDL: 40 mg/dL — ABNORMAL LOW (ref >40)
LDL Cholesterol: 111 mg/dL — ABNORMAL HIGH (ref 0–99)
Total CHOL/HDL Ratio: 5 {ratio}
Triglycerides: 252 mg/dL — ABNORMAL HIGH (ref <150)
VLDL: 50 mg/dL — ABNORMAL HIGH (ref 0–40)

## 2023-06-02 LAB — LDL CHOLESTEROL, DIRECT: Direct LDL: 116 mg/dL — ABNORMAL HIGH (ref 0–99)

## 2023-06-02 LAB — MAGNESIUM: Magnesium: 2 mg/dL (ref 1.7–2.4)

## 2023-06-02 MED ORDER — DILTIAZEM HCL 60 MG PO TABS
60.0000 mg | ORAL_TABLET | Freq: Three times a day (TID) | ORAL | Status: DC
  Administered 2023-06-02 (×3): 60 mg via ORAL
  Filled 2023-06-02 (×3): qty 1

## 2023-06-02 NOTE — Plan of Care (Signed)

## 2023-06-02 NOTE — Plan of Care (Signed)
Problem: Elimination: Goal: Will not experience complications related to bowel motility Outcome: Completed/Met Goal: Will not experience complications related to urinary retention Outcome: Completed/Met   Problem: Pain Managment: Goal: General experience of comfort will improve Outcome: Completed/Met   Problem: Skin Integrity: Goal: Risk for impaired skin integrity will decrease Outcome: Completed/Met

## 2023-06-02 NOTE — Progress Notes (Signed)
   Rounding Note    Patient Name: Marissa Barnes Date of Encounter: 06/02/2023  [facility] Cardiologist: None   Subjective   NAEO. Resting this AM. No syncopal episode.   Vital Signs    Vitals:   06/01/23 2111 06/02/23 0051 06/02/23 0544 06/02/23 0713  BP: 124/84 107/64 104/79 128/73  Pulse: 80 79 75 66  Resp: 18 18 16 20   Temp: 98.3 F (36.8 C) 98.2 F (36.8 C) 98.1 F (36.7 C) 98 F (36.7 C)  TempSrc: Oral Oral Oral Oral  SpO2: 100% 98% 98% 95%  Weight:   79.2 kg   Height:        Intake/Output Summary (Last 24 hours) at 06/02/2023 0857 Last data filed at 06/02/2023 0600 Gross per 24 hour  Intake 480 ml  Output 1701 ml  Net -1221 ml      06/02/2023    5:44 AM 06/01/2023    5:14 AM 05/31/2023    8:00 PM  Last 3 Weights  Weight (lbs) 174 lb 9.7 oz 178 lb 5.6 oz 168 lb 8.4 oz  Weight (kg) 79.2 kg 80.9 kg 76.443 kg      Telemetry    Sinus. Several NSVT episodes. - Personally Reviewed  ECG    Personally Reviewed  Physical Exam   GEN: No acute distress.   Cardiac: RRR, no murmurs, rubs, or gallops.  Respiratory: Clear to auscultation bilaterally. Psych: Normal affect   Assessment & Plan    #Ventricular tachycardia Unclear cause at this point.  Appears to be outflow tract origin. Need to assess for structural abnormalities of the heart, inflammation, infiltration. - echo showed normal EF, no valve disease. No Pericardial effusion. - cardiac MRI ordered - increase dilt today - Keep K greater than 4, mag greater than 2      [name] T. Lalla Brothers, MD, [facility], [facility] Cardiac Electrophysiology

## 2023-06-02 NOTE — Plan of Care (Signed)
Problem: Nutrition: Goal: Adequate nutrition will be maintained Outcome: Completed/Met

## 2023-06-02 NOTE — Progress Notes (Signed)
Rounding Note    Patient Name: JAMAL HASKIN Date of Encounter: 06/02/2023  [facility] Cardiologist: None  Subjective   Resting in bed comfortably. No events overnight. Daughter at bedside  Inpatient Medications    Scheduled Meds:  diltiazem  60 mg Oral TID   heparin  5,000 Units Subcutaneous Q8H   PARoxetine  40 mg Oral Daily   Continuous Infusions:  PRN Meds: acetaminophen, ALPRAZolam, ondansetron (ZOFRAN) IV   Vital Signs    Vitals:   06/02/23 0051 06/02/23 0544 06/02/23 0713 06/02/23 1146  BP: 107/64 104/79 128/73 137/81  Pulse: 79 75 66 66  Resp: 18 16 20 16   Temp: 98.2 F (36.8 C) 98.1 F (36.7 C) 98 F (36.7 C) 98.6 F (37 C)  TempSrc: Oral Oral Oral Oral  SpO2: 98% 98% 95% 94%  Weight:  79.2 kg    Height:        Intake/Output Summary (Last 24 hours) at 06/02/2023 1252 Last data filed at 06/02/2023 0600 Gross per 24 hour  Intake 480 ml  Output 1701 ml  Net -1221 ml      06/02/2023    5:44 AM 06/01/2023    5:14 AM 05/31/2023    8:00 PM  Last 3 Weights  Weight (lbs) 174 lb 9.7 oz 178 lb 5.6 oz 168 lb 8.4 oz  Weight (kg) 79.2 kg 80.9 kg 76.443 kg      Telemetry    Predominantly normal sinus rhythm with NSVT- Personally Reviewed  ECG    06/01/2023: Sinus rhythm with PVCs and ventricular bigeminy pattern - Personally Reviewed  Physical Exam  Vital signs as noted above Physical Exam  Constitutional: No distress.  hemodynamically stable  Neck: No JVD present.  Cardiovascular: Normal rate, regular rhythm, S1 normal, S2 normal, intact distal pulses and normal pulses. Exam reveals no gallop, no S3 and no S4.  No murmur heard. Pulmonary/Chest: Effort normal and breath sounds normal. No stridor. She has no wheezes. She has no rales.  Abdominal: Soft. Bowel sounds are normal. She exhibits no distension. There is no abdominal tenderness.  Musculoskeletal:        General: No edema.     Cervical back: Neck supple.   Neurological: She is alert and oriented to person, place, and time. She has intact cranial nerves (2-12).  Skin: Skin is warm and moist.   Labs    High Sensitivity Troponin:   Recent Labs  Lab 05/31/23 1502 05/31/23 1725  TROPONINIHS 7 7     Chemistry Recent Labs  Lab 05/31/23 1502 05/31/23 2256 06/01/23 0337 06/02/23 0401  NA 138  --  140 142  K 3.8  --  3.7 4.2  CL 105  --  109 111  CO2 24  --  24 23  GLUCOSE 145*  --  115* 131*  BUN 11  --  9 10  CREATININE 0.77 0.83 0.72 0.79  CALCIUM 9.1  --  8.5* 8.7*  MG 2.1  --   --  2.0  PROT 6.6  --   --   --   ALBUMIN 3.7  --   --   --   AST 25  --   --   --   ALT 18  --   --   --   ALKPHOS 106  --   --   --   BILITOT 0.9  --   --   --   GFRNONAA >60 >60 >60 >60  ANIONGAP 9  --  7 8    Lipids  Recent Labs  Lab 06/02/23 0401  CHOL 201*  TRIG 252*  HDL 40*  LDLCALC 111*  CHOLHDL 5.0    Hematology Recent Labs  Lab 05/31/23 1502 05/31/23 2256 06/02/23 0401  WBC 9.4 8.1 7.0  RBC 5.02 4.15 4.09  HGB 14.1 11.9* 11.6*  HCT 42.0 34.9* 34.4*  MCV 83.7 84.1 84.1  MCH 28.1 28.7 28.4  MCHC 33.6 34.1 33.7  RDW 12.9 12.9 12.9  PLT 242 224 197   Thyroid  Recent Labs  Lab 05/31/23 2256  TSH 4.050    BNP Recent Labs  Lab 05/31/23 1502  BNP 88.7    DDimer No results for input(s): "DDIMER" in the last 168 hours.   Radiology    CXR  05/31/2023: No active process.   Cardiac Studies   Echo: LVEF 60 to 65%, no regional wall motion abnormalities, normal diastolic function, right ventricular size and function normal, no significant valvular heart disease, estimated RAP 3 mmHg.  CMRI: Ordered.   Patient Profile     57 y.o. female with HLD, depression, PCOS and ADHD who is being seen 05/31/2023 for the evaluation of wide-complex tachycardia and recurrent dizziness.   Assessment & Plan    Nonsustained ventricular tachycardia Premature ventricular contractions. Syncope/near-syncope. Presented from her  primary cardiologist's office when she was noted to have wide-complex tachycardia on single-lead strip while getting the echocardiogram. No formal strips available for review She was initially placed on amiodarone and later discontinued/held during this hospitalization to evaluate for underlying arrhythmia. Telemetry personally reviewed-sinus rhythm with NSVT.   EKG this morning notes a sinus rhythm with frequent ventricular ectopy.  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 Currently being followed by cardiac electrophysiology-Dr. Lalla Brothers appreciate recommendations  The dose of Cardizem has been increased to 60 mg p.o. 3 times daily Cardiac MRI ordered K and Mg at goal.  No identifiable reversible cause She will need guidance with regards to driving prior to discharge.  Elevated lipids/hypertriglyceridemia: Reemphasized importance of reducing foods that are high in triglycerides/carbohydrates. 10-year risk of ASCVD is 3.3%-will hold off on pharmacological therapy at this time. Recommend coronary calcium score as outpatient for further restratification  ADHD.  Hold outpatient medication  Depression: Started by home medication.   Hyperlipidemia: Will check fasting lipids.  Currently not on statin therapy.  For questions or updates, please contact [facility] Please consult [email or web address] for contact info under     Signed, Tessa Lerner, DO, [facility]  [facility]  [address] [place], [place] [address] Pager: [phone] Office: [phone] 06/02/2023, 12:52 PM

## 2023-06-03 ENCOUNTER — Inpatient Hospital Stay (HOSPITAL_COMMUNITY): Payer: Medicaid Other

## 2023-06-03 DIAGNOSIS — I472 Ventricular tachycardia, unspecified: Secondary | ICD-10-CM

## 2023-06-03 DIAGNOSIS — R000 Tachycardia, unspecified: Secondary | ICD-10-CM | POA: Diagnosis not present

## 2023-06-03 LAB — BASIC METABOLIC PANEL
Anion gap: 9 (ref 5–15)
BUN: 12 mg/dL (ref 6–20)
CO2: 25 mmol/L (ref 22–32)
Calcium: 9.1 mg/dL (ref 8.9–10.3)
Chloride: 105 mmol/L (ref 98–111)
Creatinine, Ser: 0.77 mg/dL (ref 0.44–1.00)
GFR, Estimated: >60 mL/min (ref >60)
Glucose, Bld: 115 mg/dL — ABNORMAL HIGH (ref 70–99)
Potassium: 3.9 mmol/L (ref 3.5–5.1)
Sodium: 139 mmol/L (ref 135–145)

## 2023-06-03 LAB — MAGNESIUM: Magnesium: 2.1 mg/dL (ref 1.7–2.4)

## 2023-06-03 LAB — CBC
HCT: 34.8 % — ABNORMAL LOW (ref 36.0–46.0)
Hemoglobin: 11.6 g/dL — ABNORMAL LOW (ref 12.0–15.0)
MCH: 27.8 pg (ref 26.0–34.0)
MCHC: 33.3 g/dL (ref 30.0–36.0)
MCV: 83.5 fL (ref 80.0–100.0)
Platelets: 213 10*3/uL (ref 150–400)
RBC: 4.17 MIL/uL (ref 3.87–5.11)
RDW: 12.9 % (ref 11.5–15.5)
WBC: 6.7 10*3/uL (ref 4.0–10.5)
nRBC: 0 % (ref 0.0–0.2)

## 2023-06-03 MED ORDER — GADOBUTROL 1 MMOL/ML IV SOLN
10.0000 mL | Freq: Once | INTRAVENOUS | Status: AC | PRN
  Administered 2023-06-03: 10 mL via INTRAVENOUS

## 2023-06-03 MED ORDER — DILTIAZEM HCL ER COATED BEADS 120 MG PO CP24
120.0000 mg | ORAL_CAPSULE | Freq: Two times a day (BID) | ORAL | Status: DC
  Administered 2023-06-03 – 2023-06-04 (×3): 120 mg via ORAL
  Filled 2023-06-03 (×3): qty 1

## 2023-06-03 MED ORDER — POTASSIUM CHLORIDE CRYS ER 20 MEQ PO TBCR
20.0000 meq | EXTENDED_RELEASE_TABLET | Freq: Once | ORAL | Status: AC
  Administered 2023-06-03: 20 meq via ORAL
  Filled 2023-06-03: qty 1

## 2023-06-03 NOTE — Progress Notes (Signed)
   Rounding Note    Patient Name: Marissa Barnes Date of Encounter: 06/03/2023  [facility] Cardiologist: None   Subjective   NAEO, feels well  Vital Signs    Vitals:   06/02/23 1916 06/03/23 0102 06/03/23 0501 06/03/23 0658  BP: 122/81 129/81 93/82   Pulse: 82 78 65   Resp: 18 18 18    Temp:  98.1 F (36.7 C) 98.5 F (36.9 C)   TempSrc:  Oral Oral   SpO2: 98% 91% 97%   Weight:    76.9 kg  Height:        Intake/Output Summary (Last 24 hours) at 06/03/2023 0728 Last data filed at 06/03/2023 0600 Gross per 24 hour  Intake --  Output 675 ml  Net -675 ml      06/03/2023    6:58 AM 06/02/2023    5:44 AM 06/01/2023    5:14 AM  Last 3 Weights  Weight (lbs) 169 lb 8 oz 174 lb 9.7 oz 178 lb 5.6 oz  Weight (kg) 76.885 kg 79.2 kg 80.9 kg      Telemetry    SR 60's, PACs occassional, no VT. - Personally Reviewed  ECG    No new EKGs  Physical Exam   Pt seen/examined by Dr. Lalla Brothers, no change to exam GEN: No acute distress.   Cardiac: RRR, no murmurs, rubs, or gallops.  Respiratory:  CTA b/l Psych: Normal affect   Assessment & Plan    #Ventricular tachycardia Unclear cause at this point.   Dr. Lalla Brothers suspects outflow tract origin.  Pending c.MRI for today, to assess for structural abnormalities of the heart, inflammation, infiltration. - echo showed normal EF, no valve disease. No Pericardial effusion. - transition to 120mg  BID dilt today - Keep K greater than 4, mag greater than 2 K+ ordered    Francis Dowse, PA-C

## 2023-06-04 ENCOUNTER — Other Ambulatory Visit (HOSPITAL_COMMUNITY): Payer: Self-pay

## 2023-06-04 DIAGNOSIS — R000 Tachycardia, unspecified: Secondary | ICD-10-CM | POA: Diagnosis not present

## 2023-06-04 LAB — BASIC METABOLIC PANEL
Anion gap: 11 (ref 5–15)
BUN: 11 mg/dL (ref 6–20)
CO2: 23 mmol/L (ref 22–32)
Calcium: 9.7 mg/dL (ref 8.9–10.3)
Chloride: 104 mmol/L (ref 98–111)
Creatinine, Ser: 0.88 mg/dL (ref 0.44–1.00)
GFR, Estimated: >60 mL/min (ref >60)
Glucose, Bld: 136 mg/dL — ABNORMAL HIGH (ref 70–99)
Potassium: 4.1 mmol/L (ref 3.5–5.1)
Sodium: 138 mmol/L (ref 135–145)

## 2023-06-04 LAB — CBC
HCT: 40.6 % (ref 36.0–46.0)
Hemoglobin: 13.8 g/dL (ref 12.0–15.0)
MCH: 28.6 pg (ref 26.0–34.0)
MCHC: 34 g/dL (ref 30.0–36.0)
MCV: 84.1 fL (ref 80.0–100.0)
Platelets: 265 10*3/uL (ref 150–400)
RBC: 4.83 MIL/uL (ref 3.87–5.11)
RDW: 12.8 % (ref 11.5–15.5)
WBC: 6.9 10*3/uL (ref 4.0–10.5)
nRBC: 0 % (ref 0.0–0.2)

## 2023-06-04 LAB — MAGNESIUM: Magnesium: 2.1 mg/dL (ref 1.7–2.4)

## 2023-06-04 MED ORDER — DILTIAZEM HCL ER COATED BEADS 120 MG PO CP24
120.0000 mg | ORAL_CAPSULE | Freq: Two times a day (BID) | ORAL | 5 refills | Status: DC
  Filled 2023-06-04: qty 60, 30d supply, fill #0

## 2023-06-04 NOTE — Discharge Instructions (Signed)
NO DRIVING 6 MONTHS

## 2023-06-04 NOTE — Progress Notes (Signed)
IV tele removed headed home with daughter  NT to stop by [facility] pharamcy to pick up meds

## 2023-06-04 NOTE — TOC Transition Note (Signed)
Transition of Care [facility]) - CM/SW Discharge Note   Patient Details  Name: Marissa Barnes MRN: [id number] Date of Birth: 11-05-1965  [facility]) CM/SW Contact:  Leone Haven, RN Phone Number: 06/04/2023, 10:13 AM   Clinical Narrative:    For dc today, daughter at bedside to transport home.   Final next level of care: Home/Self Care Barriers to Discharge: No Barriers Identified   Patient Goals and CMS Choice   Choice offered to / list presented to : NA  Discharge Placement                         Discharge Plan and Services Additional resources added to the After Visit Summary for   In-house Referral: NA Discharge Planning Services: CM Consult Post Acute Care Choice: NA          DME Arranged: N/A DME Agency: NA       HH Arranged: NA          Social Determinants of Health (SDOH) Interventions SDOH Screenings   Food Insecurity: No Food Insecurity (05/31/2023)  Recent Concern: Food Insecurity - Food Insecurity Present (05/29/2023)   Received from [facility]  Housing: Low Risk  (05/31/2023)  Transportation Needs: No Transportation Needs (05/31/2023)  Utilities: Not At Risk (05/31/2023)  Financial Resource Strain: Medium Risk (05/29/2023)   Received from [facility]  Physical Activity: Sufficiently Active (05/29/2023)   Received from [facility]  Social Connections: Moderately Integrated (05/29/2023)   Received from [facility]  Stress: No Stress Concern Present (05/29/2023)   Received from [facility]  Tobacco Use: Low Risk  (05/31/2023)     Readmission Risk Interventions     No data to display

## 2023-06-04 NOTE — Discharge Summary (Signed)
DISCHARGE SUMMARY    Patient ID: Marissa Barnes,  MRN: [id number], DOB/AGE: 1966/07/26 57 y.o.  Admit date: 05/31/2023 Discharge date: 06/04/2023  Primary Care Physician: [facility]  Primary Cardiologist: [facility] Electrophysiologist: new to Dr. Winfred Burn  Primary Discharge Diagnosis:  Syncope Recurrent NSVTs  Secondary Discharge Diagnosis:  HLD PCOS  No Known Allergies   Procedures This Admission:  none   Brief HPI: Marissa Barnes is a 57 y.o. female was referred initially out patient to [facility] cardiology for recurrent dizziness and syncopal event. Planned for evaluation, while she was getting her echo done she was observed to have recurrent runs of NSWCT and emergently sent to [facility] where she was admitted for further evaluation Labs largely unremarkable HS trops neg x2  Hospital Course:  The patient was initially placed on amiodarone quickly stopped.  Overnight developed recurrent NSVTs and in consult with EP suspect outflow tract VT and started on diltiazem.  Cardiac MRI was normal, TTE with preserved LVE, no VHD of significance  She was monitored throughout her stay with marked improvement in VT burden, to none in the last 24 hours.  BP and HR tolerating well She was examined by Dr. Lalla Brothers and considered stable for discharge to home.   Advised heart healthy diet/follow up with her PMD for her lipids  Dr. Lalla Brothers discussed  law, no driving 6 months  Physical Exam: Vitals:   06/04/23 0015 06/04/23 0511 06/04/23 0748 06/04/23 0858  BP: 129/78 132/77 (!) 156/98 138/83  Pulse: 70  68 73  Resp: 18 20 14 16   Temp: 98.2 F (36.8 C) 98.7 F (37.1 C) 98.1 F (36.7 C)   TempSrc: Oral Oral Oral   SpO2: 96% 97% 99% 95%  Weight:      Height:        GEN- The patient is well appearing, alert and oriented x 3 today.   HEENT: normocephalic, atraumatic; sclera clear, conjunctiva pink; hearing intact; oropharynx clear; neck supple, no JVP Lungs-  CTA b/l, normal work of breathing.  No wheezes, rales, rhonchi Heart- RRR, no murmurs, rubs or gallops, PMI not laterally displaced GI- soft, non-tender, non-distended Extremities- no clubbing, cyanosis, or edema MS- no significant deformity or atrophy Skin- warm and dry, no rash or lesion Psych- euthymic mood, full affect Neuro- no gross deficits   Labs:   Lab Results  Component Value Date   WBC 6.7 06/03/2023   HGB 11.6 (L) 06/03/2023   HCT 34.8 (L) 06/03/2023   MCV 83.5 06/03/2023   PLT 213 06/03/2023    Recent Labs  Lab 05/31/23 1502 05/31/23 2256 06/03/23 0337  NA 138   < > 139  K 3.8   < > 3.9  CL 105   < > 105  CO2 24   < > 25  BUN 11   < > 12  CREATININE 0.77   < > 0.77  CALCIUM 9.1   < > 9.1  PROT 6.6  --   --   BILITOT 0.9  --   --   ALKPHOS 106  --   --   ALT 18  --   --   AST 25  --   --   GLUCOSE 145*   < > 115*   < > = values in this interval not displayed.    Discharge Medications:  Allergies as of 06/04/2023   No Known Allergies      Medication List  TAKE these medications    ALPRAZolam 0.5 MG tablet Commonly known as: XANAX Take 0.5 mg by mouth 2 (two) times daily as needed for anxiety.   amphetamine-dextroamphetamine 30 MG tablet Commonly known as: ADDERALL Take 30 mg by mouth 2 (two) times daily.   diltiazem 120 MG 24 hr capsule Commonly known as: CARDIZEM CD Take 1 capsule (120 mg total) by mouth 2 (two) times daily.   naproxen 500 MG tablet Commonly known as: NAPROSYN Take 500 mg by mouth 2 (two) times daily with a meal.   PARoxetine 40 MG tablet Commonly known as: PAXIL Take 40 mg by mouth daily.   polyethylene glycol 17 g packet Commonly known as: MIRALAX / GLYCOLAX Take 17 g by mouth daily as needed for moderate constipation.        Disposition: home Discharge Instructions     Diet - low sodium heart healthy   Complete by: As directed    Increase activity slowly   Complete by: As directed          Duration of Discharge Encounter: Greater than 30 minutes including physician time.  Norma Fredrickson, PA-C 06/04/2023 9:50 AM

## 2023-06-04 NOTE — Progress Notes (Signed)
Patient cared for by orientee; see documentation.

## 2023-06-04 NOTE — TOC Initial Note (Signed)
Transition of Care [facility]) - Initial/Assessment Note    Patient Details  Name: Marissa Barnes MRN: [id number] Date of Birth: June 14, 1966  Transition [facility] Care [facility]) CM/SW Contact:    Leone Haven, RN Phone Number: 06/04/2023, 10:12 AM  Clinical Narrative:                 From home with daughter, has PCP and insurance on file, states has no HH services in place at this time or DME at home.  States family member will transport them home at [employer] and family is support system, states gets medications from [facility] on [address]  Pta self ambulatory.   Expected Discharge Plan: Home/Self Care Barriers to Discharge: No Barriers Identified   Patient Goals and CMS Choice Patient states their goals for this hospitalization and ongoing recovery are:: return home   Choice offered to / list presented to : NA      Expected Discharge Plan and Services In-house Referral: NA Discharge Planning Services: CM Consult Post Acute Care Choice: NA Living arrangements for the past 2 months: Single Family Home Expected Discharge Date: 06/04/23               DME Arranged: N/A DME Agency: NA       HH Arranged: NA          Prior Living Arrangements/Services Living arrangements for the past 2 months: Single Family Home Lives with:: Adult Children Patient language and need for interpreter reviewed:: Yes Do you feel safe going back to the place where you live?: Yes      Need for Family Participation in Patient Care: Yes (Comment) Care giver support system in place?: Yes (comment)   Criminal Activity/Legal Involvement Pertinent to Current Situation/Hospitalization: No - Comment as needed  Activities of Daily Living   ADL Screening (condition at time of admission) Independently performs ADLs?: Yes (appropriate for developmental age) Is the patient deaf or have difficulty hearing?: No Does the patient have difficulty seeing, even when wearing glasses/contacts?: No Does the patient have  difficulty concentrating, remembering, or making decisions?: No  Permission Sought/Granted Permission sought to share information with : Case Manager Permission granted to share information with : Yes, Verbal Permission Granted              Emotional Assessment Appearance:: Appears stated age Attitude/Demeanor/Rapport: Engaged Affect (typically observed): Appropriate Orientation: : Oriented to Self, Oriented to Place, Oriented to  Time, Oriented to Situation Alcohol / Substance Use: Not Applicable Psych Involvement: No (comment)  Admission diagnosis:  Ventricular tachycardia (HCC) [I47.20] Wide-complex tachycardia [R00.0] Patient Active Problem List   Diagnosis Date Noted   Pure hypertriglyceridemia 06/02/2023   NSVT (nonsustained ventricular tachycardia) (HCC) 06/01/2023   PVC (premature ventricular contraction) 06/01/2023   Syncope 06/01/2023   Attention deficit hyperactivity disorder (ADHD) 06/01/2023   Depression 06/01/2023   Mixed hyperlipidemia 06/01/2023   Wide-complex tachycardia 05/31/2023   OA (osteoarthritis) of hip 05/20/2019   PCP:  [facility] Pharmacy:   [id number] [place], [place] - [address] AT [facility] [address] [place] [address] Phone: [phone] Fax: [phone]  [facility] [address] [place] [address] Phone: [phone] Fax: [phone]     Social Determinants of Health (SDOH) Social History: SDOH Screenings   Food Insecurity: No Food Insecurity (05/31/2023)  Recent Concern: Food Insecurity - Food Insecurity Present (05/29/2023)   Received from [facility]  Housing: Low Risk  (05/31/2023)  Transportation Needs: No Transportation Needs (05/31/2023)  Utilities: Not At Risk (05/31/2023)  Financial Resource Strain: Medium Risk (05/29/2023)   Received from [facility]  Physical Activity: Sufficiently Active (05/29/2023)    Received from [facility]  Social Connections: Moderately Integrated (05/29/2023)   Received from [facility]  Stress: No Stress Concern Present (05/29/2023)   Received from [facility]  Tobacco Use: Low Risk  (05/31/2023)   SDOH Interventions:     Readmission Risk Interventions     No data to display

## 2023-06-21 ENCOUNTER — Other Ambulatory Visit: Payer: Self-pay

## 2023-06-21 ENCOUNTER — Emergency Department (HOSPITAL_COMMUNITY): Payer: Medicaid Other

## 2023-06-21 ENCOUNTER — Emergency Department (HOSPITAL_COMMUNITY)
Admission: EM | Admit: 2023-06-21 | Discharge: 2023-06-22 | Disposition: A | Discharge status: Home / Self Care | Payer: Medicaid Other | Attending: Emergency Medicine | Admitting: Emergency Medicine

## 2023-06-21 ENCOUNTER — Encounter (HOSPITAL_COMMUNITY): Payer: Self-pay | Admitting: *Deleted

## 2023-06-21 DIAGNOSIS — R42 Dizziness and giddiness: Secondary | ICD-10-CM | POA: Diagnosis present

## 2023-06-21 DIAGNOSIS — R0602 Shortness of breath: Secondary | ICD-10-CM | POA: Diagnosis not present

## 2023-06-21 DIAGNOSIS — R002 Palpitations: Secondary | ICD-10-CM | POA: Diagnosis not present

## 2023-06-21 LAB — CBC
HCT: 34.9 % — ABNORMAL LOW (ref 36.0–46.0)
Hemoglobin: 11.8 g/dL — ABNORMAL LOW (ref 12.0–15.0)
MCH: 28.3 pg (ref 26.0–34.0)
MCHC: 33.8 g/dL (ref 30.0–36.0)
MCV: 83.7 fL (ref 80.0–100.0)
Platelets: 223 10*3/uL (ref 150–400)
RBC: 4.17 MIL/uL (ref 3.87–5.11)
RDW: 13.4 % (ref 11.5–15.5)
WBC: 7 10*3/uL (ref 4.0–10.5)
nRBC: 0 % (ref 0.0–0.2)

## 2023-06-21 LAB — BASIC METABOLIC PANEL
Anion gap: 9 (ref 5–15)
BUN: 12 mg/dL (ref 6–20)
CO2: 24 mmol/L (ref 22–32)
Calcium: 9.4 mg/dL (ref 8.9–10.3)
Chloride: 105 mmol/L (ref 98–111)
Creatinine, Ser: 0.8 mg/dL (ref 0.44–1.00)
GFR, Estimated: >60 mL/min (ref >60)
Glucose, Bld: 113 mg/dL — ABNORMAL HIGH (ref 70–99)
Potassium: 3.7 mmol/L (ref 3.5–5.1)
Sodium: 138 mmol/L (ref 135–145)

## 2023-06-21 LAB — TROPONIN I (HIGH SENSITIVITY): Troponin I (High Sensitivity): 3 ng/L (ref <18)

## 2023-06-21 LAB — TSH: TSH: 2.779 u[IU]/mL (ref 0.350–4.500)

## 2023-06-21 NOTE — ED Triage Notes (Signed)
The pt was treated October 11th for vt  since yesterday she has had some of the same symptoms dizziness heart beating irregularly and  and sob

## 2023-06-21 NOTE — ED Provider Triage Note (Signed)
Emergency Medicine Provider Triage Evaluation Note  Marissa Barnes , a 57 y.o. female  was evaluated in triage.  Pt complains of heart palpitation. Pt was recently diagnosed with ventricular tachycardia less than a month ago.  She's here with bouts of dizziness and heart palpitation since yesterday, improves with cardizem.  No other environmental changes.  Felt better.  No fever, productive cough, chest pain, neck swelling  Review of Systems  Positive: As above Negative: As above  Physical Exam  BP 127/80 (BP Location: Right Arm)   Pulse 91   Temp 98.3 F (36.8 C)   Resp 14   Ht 5\' 7"  (1.702 m)   Wt 76.9 kg   SpO2 100%   BMI 26.55 kg/m  Gen:   Awake, no distress   Resp:  Normal effort  MSK:   Moves extremities without difficulty  Other:    Medical Decision Making  Medically screening exam initiated at 9:12 PM.  Appropriate orders placed.  Marissa Barnes was informed that the remainder of the evaluation will be completed by another provider, this initial triage assessment does not replace that evaluation, and the importance of remaining in the ED until their evaluation is complete.     Fayrene Helper, PA-C 06/21/23 2113

## 2023-06-21 NOTE — ED Provider Notes (Signed)
[place] EMERGENCY DEPARTMENT AT [facility] Provider Note   CSN: [id number] Arrival date & time: 06/21/23  2035     History {Add pertinent medical, surgical, social history, OB history to HPI:1} Chief Complaint  Patient presents with   Dizziness    Marissa Barnes is a 57 y.o. female.  Patient with recent hospitalization due to runs of ventricular tachycardia presents to the emergency department complaining of lightheadedness and palpitations with some associated shortness of breath which began this morning.  Patient states that she took her Cardizem as prescribed this morning before going to work at approximately 630.  She states that at 5 PM she began to feel lightheaded and felt that her heart was racing.  The symptoms felt very similar to when she had the episodes of ventricular tachycardia earlier in the month.  She states she took an additional dose of Cardizem as well as a dose of Xanax and things have calm down.  She is currently asymptomatic complaining of no chest pain, no palpitations, no shortness of breath, no nausea, vomiting, abdominal pain.  Past medical history significant for nonsustained ventricular tachycardia, syncope, anxiety   Dizziness      Home Medications Prior to Admission medications   Medication Sig Start Date End Date Taking? Authorizing Provider  ALPRAZolam Prudy Feeler) 0.5 MG tablet Take 0.5 mg by mouth 2 (two) times daily as needed for anxiety.     [provider]  amphetamine-dextroamphetamine (ADDERALL) 30 MG tablet Take 30 mg by mouth 2 (two) times daily.    [provider]  diltiazem (CARDIZEM CD) 120 MG 24 hr capsule Take 1 capsule (120 mg total) by mouth 2 (two) times daily. 06/04/23   Sheilah Pigeon, PA-C  naproxen (NAPROSYN) 500 MG tablet Take 500 mg by mouth 2 (two) times daily with a meal. Patient not taking: Reported on 05/31/2023 05/30/23   [provider]  PARoxetine (PAXIL) 40 MG tablet Take 40 mg by  mouth daily.  09/02/18   [provider]  polyethylene glycol (MIRALAX / GLYCOLAX) 17 g packet Take 17 g by mouth daily as needed for moderate constipation.    [provider]      Allergies    Patient has no known allergies.    Review of Systems   Review of Systems  Neurological:  Positive for dizziness.    Physical Exam Updated Vital Signs BP 127/80 (BP Location: Right Arm)   Pulse 91   Temp 98.3 F (36.8 C)   Resp 14   Ht 5\' 7"  (1.702 m)   Wt 76.9 kg   SpO2 100%   BMI 26.55 kg/m  Physical Exam Vitals and nursing note reviewed.  Constitutional:      General: She is not in acute distress.    Appearance: She is well-developed.  HENT:     Head: Normocephalic and atraumatic.  Eyes:     Conjunctiva/sclera: Conjunctivae normal.  Cardiovascular:     Rate and Rhythm: Normal rate and regular rhythm.     Heart sounds: No murmur heard. Pulmonary:     Effort: Pulmonary effort is normal. No respiratory distress.     Breath sounds: Normal breath sounds.  Abdominal:     Palpations: Abdomen is soft.     Tenderness: There is no abdominal tenderness.  Musculoskeletal:        General: No swelling.     Cervical back: Neck supple.  Skin:    General: Skin is warm and dry.  Capillary Refill: Capillary refill takes less than 2 seconds.  Neurological:     Mental Status: She is alert.  Psychiatric:        Mood and Affect: Mood normal.     ED Results / Procedures / Treatments   Labs (all labs ordered are listed, but only abnormal results are displayed) Labs Reviewed  BASIC METABOLIC PANEL - Abnormal; Notable for the following components:      Result Value   Glucose, Bld 113 (*)    All other components within normal limits  CBC - Abnormal; Notable for the following components:   Hemoglobin 11.8 (*)    HCT 34.9 (*)    All other components within normal limits  TSH  TROPONIN I (HIGH SENSITIVITY)  TROPONIN I (HIGH SENSITIVITY)     EKG None  Radiology DG Chest 2 View  Result Date: 06/21/2023 CLINICAL DATA:  Dizziness, tachycardia EXAM: CHEST - 2 VIEW COMPARISON:  05/31/2023 FINDINGS: The heart size and mediastinal contours are within normal limits. Both lungs are clear. The visualized skeletal structures are unremarkable. IMPRESSION: No active cardiopulmonary disease. Electronically Signed   By: Charlett Nose M.D.   On: 06/21/2023 22:11    Procedures Procedures  {Document cardiac monitor, telemetry assessment procedure when appropriate:1}  Medications Ordered in ED Medications - No data to display  ED Course/ Medical Decision Making/ A&P   {   Click here for ABCD2, HEART and other calculatorsREFRESH Note before signing :1}                              Medical Decision Making  This patient presents to the ED for concern of palpitations, this involves an extensive number of treatment options, and is a complaint that carries with it a high risk of complications and morbidity.  The differential diagnosis includes dysrhythmia, endocrine disorder, metabolic abnormality, dehydration, anxiety, others   Co morbidities that complicate the patient evaluation  History of ventricular tachycardia   Additional history obtained:   External records from outside source obtained and reviewed including discharge summary from October   Lab Tests:  I Ordered, and personally interpreted labs.  The pertinent results include: Grossly unremarkable BMP, CBC.  TSH 2.779, initial troponin 3   Imaging Studies ordered:  I ordered imaging studies including chest x-ray I independently visualized and interpreted imaging which showed no acute findings I agree with the radiologist interpretation   Cardiac Monitoring: / EKG:  The patient was maintained on a cardiac monitor.  I personally viewed and interpreted the cardiac monitored which showed an underlying rhythm of: Normal sinus rhythm   Consultations Obtained:  I  requested consultation with the ***,  and discussed lab and imaging findings as well as pertinent plan - they recommend: ***   Problem List / ED Course / Critical interventions / Medication management  *** I ordered medication including ***  for ***  Reevaluation of the patient after these medicines showed that the patient {resolved/improved/worsened:23923::"improved"} I have reviewed the patients home medicines and have made adjustments as needed   Social Determinants of Health:  Patient has Medicaid for her primary health insurance type   Test / Admission - Considered:  ***   {Document critical care time when appropriate:1} {Document review of labs and clinical decision tools ie heart score, Chads2Vasc2 etc:1}  {Document your independent review of radiology images, and any outside records:1} {Document your discussion with family members, caretakers, and with consultants:1} {Document  social determinants of health affecting pt's care:1} {Document your decision making why or why not admission, treatments were needed:1} Final Clinical Impression(s) / ED Diagnoses Final diagnoses:  None    Rx / DC Orders ED Discharge Orders     None

## 2023-06-21 NOTE — ED Notes (Signed)
Pt currently in triage 2B

## 2023-06-22 LAB — TROPONIN I (HIGH SENSITIVITY): Troponin I (High Sensitivity): 4 ng/L (ref <18)

## 2023-06-22 NOTE — Discharge Instructions (Signed)
Your workup tonight was reassuring.  Please continue to take your home Cardizem as prescribed and follow-up with your cardiologist.  If you develop life-threatening symptoms such as shortness of breath or chest pain please return to the emergency department for further evaluation.

## 2023-07-06 ENCOUNTER — Other Ambulatory Visit (HOSPITAL_COMMUNITY): Payer: Self-pay

## 2023-07-08 NOTE — Progress Notes (Unsigned)
Electrophysiology Office Note:   Date:  07/09/2023  ID:  Marissa Barnes, DOB [birth year], MRN [id number]  Primary Cardiologist: None Electrophysiologist: None      History of Present Illness:   Marissa Barnes is a 57 y.o. female with h/o NSVT, PVC's, HLD, ADHD seen today for routine electrophysiology followup.   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  She was treated with calcium channel blockers and responded well. If break though episodes, consideration for EP study was recommended. No driving for 6 months (start 05/31/23)  Recently seen in in ER on 11/1 with lightheadedness / dizziness upon waking before work that felt like prior VT. She took her prescribed cardizem and a xanax. EKG was wnl, troponin negative, TSH 2.7 and patient was discharged with Cardiology follow up.   Since last being seen in our clinic the patient reports she continues to have episodes of palpitations, lightheadedness, hot/flushed and feeling as if she is "going to go down" prompting her to have to grab hold of something. Episodes last ~ 3 seconds. 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  After her hospitalization in Oct, she stopped taking her Adderall for 7 days to see if she had less palpitations off but did not see any improvement in symptoms. She now has some shortness of breath with work that she did not have before.  She smokes THC nightly before bed for sleep.   She denies chest pain, palpitations, dyspnea, PND, orthopnea, nausea, vomiting, dizziness, syncope, edema, weight gain, or early satiety.   Review of systems complete and found to be negative unless listed in HPI.   EP Information / Studies Reviewed:    EKG is ordered today. Personal review as below.  EKG Interpretation Date/Time:  Tuesday July 09 2023 08:53:00 EST Ventricular Rate:  70 PR Interval:  152 QRS Duration:  82 QT  Interval:  398 QTC Calculation: 429 R Axis:   0  Text Interpretation: Normal sinus rhythm Confirmed by Canary Brim ([id number]) on 07/09/2023 8:57:45 AM   Studies:  ECHO 06/01/23 > LVEF 60-65%, no RWMA cMRI 06/03/23 > normal biventricular chamber size and function, LVEF 55%, RVEF 54%, no delayed myocardial enhancement, no findings to suggest scar, infiltrative or inflammatory process  Arrhythmia / AAD NSVT, PVC's > started on diltiazem in 05/2023   LTM 7d Cardiac Monitor 11/19 >       Physical Exam:   VS:  BP 120/72 (BP Location: Left Arm, Patient Position: Sitting, Cuff Size: Normal)   Pulse 69   Resp 16   Ht 5\' 7"  (1.702 m)   Wt 179 lb 9.6 oz (81.5 kg)   SpO2 98%   BMI 28.13 kg/m    Wt Readings from Last 3 Encounters:  07/09/23 179 lb 9.6 oz (81.5 kg)  06/21/23 169 lb 8.5 oz (76.9 kg)  06/03/23 169 lb 8 oz (76.9 kg)     GEN: Well nourished, well developed in no acute distress NECK: No JVD; No carotid bruits CARDIAC: Regular rate and rhythm, occ ectopy, no murmurs, rubs, gallops RESPIRATORY:  Clear to auscultation without rales, wheezing or rhonchi  ABDOMEN: Soft, non-tender, non-distended EXTREMITIES:  No edema; No deformity   ASSESSMENT AND PLAN:    Ventricular Tachycardia  Frequent PVC's  cMRI w/o LGE, normal structure of heart and LVEF. Thought to be outflow tract in origin.  -increase cardizem to 180 mg BID   -Zio  for 7days to correlate symptoms with heart rhythm -working toward appt with Dr. Lalla Brothers to discuss ablation pending review of above (his RN is working on appt availability) -if recurrent episodes, consider EP study for ablation  -have asked pt to not use THC for the period of the monitor to see if symptoms improve   HLD  -follow with primary for lipid control   Follow up with EP APP in 4 weeks to review above  Signed, Canary Brim, MSN, APRN, NP-C, AGACNP-BC [facility] - Electrophysiology  07/09/2023, 9:27 AM

## 2023-07-09 ENCOUNTER — Ambulatory Visit (INDEPENDENT_AMBULATORY_CARE_PROVIDER_SITE_OTHER): Payer: Medicaid Other

## 2023-07-09 ENCOUNTER — Encounter: Payer: Self-pay | Admitting: Pulmonary Disease

## 2023-07-09 ENCOUNTER — Ambulatory Visit: Payer: Medicaid Other | Attending: Pulmonary Disease | Admitting: Pulmonary Disease

## 2023-07-09 ENCOUNTER — Other Ambulatory Visit: Payer: Self-pay | Admitting: Pulmonary Disease

## 2023-07-09 VITALS — BP 120/72 | HR 69 | Resp 16 | Ht 67.0 in | Wt 179.6 lb

## 2023-07-09 DIAGNOSIS — I4729 Other ventricular tachycardia: Secondary | ICD-10-CM

## 2023-07-09 DIAGNOSIS — I493 Ventricular premature depolarization: Secondary | ICD-10-CM

## 2023-07-09 DIAGNOSIS — R42 Dizziness and giddiness: Secondary | ICD-10-CM

## 2023-07-09 DIAGNOSIS — R002 Palpitations: Secondary | ICD-10-CM

## 2023-07-09 MED ORDER — DILTIAZEM HCL ER COATED BEADS 180 MG PO CP24: 180.0000 mg | ORAL_CAPSULE | Freq: Two times a day (BID) | ORAL | 3 refills | Status: DC

## 2023-07-09 NOTE — Progress Notes (Unsigned)
Enrolled for [employer] to mail a ZIO XT long term holter monitor to the patients address on file.  

## 2023-07-09 NOTE — Patient Instructions (Signed)
Medication Instructions:  Increase Cardizem 180 mg twice a day  *If you need a refill on your cardiac medications before your next appointment, please call your pharmacy*   Lab Work:  If you have labs (blood work) drawn today and your tests are completely normal, you will receive your results only by: MyChart Message (if you have MyChart) OR A paper copy in the mail If you have any lab test that is abnormal or we need to change your treatment, we will call you to review the results.   Testing/Procedures: Christena Deem- Long Term Monitor Instructions  Your physician has requested you wear a ZIO patch monitor for 7 days.  This is a single patch monitor. [employer] supplies one patch monitor per enrollment. Additional stickers are not available. Please do not apply patch if you will be having a Nuclear Stress Test,  Echocardiogram, Cardiac CT, MRI, or Chest Xray during the period you would be wearing the  monitor. The patch cannot be worn during these tests. You cannot remove and re-apply the  ZIO XT patch monitor.  Your ZIO patch monitor will be mailed 3 day USPS to your address on file. It may take 3-5 days  to receive your monitor after you have been enrolled.  Once you have received your monitor, please review the enclosed instructions. Your monitor  has already been registered assigning a specific monitor serial # to you.  Billing and Patient Assistance Program Information  We have supplied [employer] with any of your insurance information on file for billing purposes. [employer] offers a sliding scale Patient Assistance Program for patients that do not have  insurance, or whose insurance does not completely cover the cost of the ZIO monitor.  You must apply for the Patient Assistance Program to qualify for this discounted rate.  To apply, please call [employer] at [phone], select option 4, select option 2, ask to apply for  Patient Assistance Program. Meredeth Ide will ask your household income,  and how many people  are in your household. They will quote your out-of-pocket cost based on that information.  [employer] will also be able to set up a 68-month, interest-free payment plan if needed.  Applying the monitor   Shave hair from upper left chest.  Hold abrader disc by orange tab. Rub abrader in 40 strokes over the upper left chest as  indicated in your monitor instructions.  Clean area with 4 enclosed alcohol pads. Let dry.  Apply patch as indicated in monitor instructions. Patch will be placed under collarbone on left  side of chest with arrow pointing upward.  Rub patch adhesive wings for 2 minutes. Remove white label marked "1". Remove the white  label marked "2". Rub patch adhesive wings for 2 additional minutes.  While looking in a mirror, press and release button in center of patch. A small green light will  flash 3-4 times. This will be your only indicator that the monitor has been turned on.  Do not shower for the first 24 hours. You may shower after the first 24 hours.  Press the button if you feel a symptom. You will hear a small click. Record Date, Time and  Symptom in the Patient Logbook.  When you are ready to remove the patch, follow instructions on the last 2 pages of Patient  Logbook. Stick patch monitor onto the last page of Patient Logbook.  Place Patient Logbook in the blue and white box. Use locking tab on box and tape box closed  securely.  The blue and white box has prepaid postage on it. Please place it in the mailbox as  soon as possible. Your physician should have your test results approximately 7 days after the  monitor has been mailed back to [facility].  Call [facility] Customer Care at [phone] if you have questions regarding  your ZIO XT patch monitor. Call them immediately if you see an orange light blinking on your  monitor.  If your monitor falls off in less than 4 days, contact our Monitor department at [phone].  If your monitor  becomes loose or falls off after 4 days call [employer] at [phone] for  suggestions on securing your monitor    Follow-Up: At [facility], you and your health needs are our priority.  As part of our continuing mission to provide you with exceptional heart care, we have created designated Provider Care Teams.  These Care Teams include your primary Cardiologist (physician) and Advanced Practice Providers (APPs -  Physician Assistants and Nurse Practitioners) who all work together to provide you with the care you need, when you need it.  We recommend signing up for the patient portal called "MyChart".  Sign up information is provided on this After Visit Summary.  MyChart is used to connect with patients for Virtual Visits (Telemedicine).  Patients are able to view lab/test results, encounter notes, upcoming appointments, etc.  Non-urgent messages can be sent to your provider as well.   To learn more about what you can do with MyChart, go to [email or web address].    Your next appointment:  one month with Canary Brim

## 2023-07-11 ENCOUNTER — Telehealth: Payer: Self-pay | Admitting: Cardiology

## 2023-07-11 NOTE — Telephone Encounter (Signed)
Spoke with the patient and scheduled her an appointment with Dr. Lalla Brothers to discuss ablation per Canary Brim, NP.

## 2023-07-11 NOTE — Telephone Encounter (Signed)
Returning call to nurse °

## 2023-07-14 DIAGNOSIS — R002 Palpitations: Secondary | ICD-10-CM | POA: Diagnosis not present

## 2023-07-14 DIAGNOSIS — R42 Dizziness and giddiness: Secondary | ICD-10-CM | POA: Diagnosis not present

## 2023-07-14 DIAGNOSIS — I493 Ventricular premature depolarization: Secondary | ICD-10-CM

## 2023-07-14 DIAGNOSIS — I4729 Other ventricular tachycardia: Secondary | ICD-10-CM | POA: Diagnosis not present

## 2023-07-15 ENCOUNTER — Telehealth: Payer: Self-pay | Admitting: Cardiology

## 2023-07-15 NOTE — Telephone Encounter (Signed)
Pt calling to inform us that her heart monitor stopped working on the second day and she called [employer] and they told the pt to reach out and inform us. Please advise

## 2023-07-15 NOTE — Telephone Encounter (Signed)
Spoke with patient and she wanted to inform us that her ZIO monitor stopped working, She will send it in and [employer] will send her a new one.

## 2023-07-22 ENCOUNTER — Ambulatory Visit: Payer: Medicaid Other | Admitting: Pulmonary Disease

## 2023-08-06 ENCOUNTER — Telehealth: Payer: Self-pay | Admitting: *Deleted

## 2023-08-06 MED ORDER — DILTIAZEM HCL ER COATED BEADS 240 MG PO CP24: 240.0000 mg | ORAL_CAPSULE | Freq: Every day | ORAL | Status: DC

## 2023-08-06 NOTE — Progress Notes (Deleted)
  Electrophysiology Office Follow up Visit Note:    Date:  08/06/2023   ID:  Marissa Barnes, DOB 02-28-1966, MRN [id number]  PCP:  [facility]  [facility] Cardiologist:  None  [facility] Electrophysiologist:  None    Interval History:     Marissa Barnes is a 57 y.o. female who presents for a follow up visit.   I met the patient when she was hospitalized for VT. She was started on cardizem with improvement in burden and discharged (October 2024). She saw Brandi in clinic 07/09/2023. At that appointment, she reported ongoing episodes of presyncope. She reported continued THC and Adderall use at that appointment. A monitor was ordered which returned showing episodes of rapid VT.  When I saw the abnormal monitor, the patient's CCB was uptitrated. She presents today to discuss other treatment options.       Past medical, surgical, social and family history were reviewed.  ROS:   Please see the history of present illness.    All other systems reviewed and are negative.  EKGs/Labs/Other Studies Reviewed:    The following studies were reviewed today:  08/05/2023 Zio monitor personally reviewed HR 57 - 285, average 82 bpm. 1 nonsustained SVT lasting 4 beats. 18 nonsustained VT, longest 7 seconds with an average rate of 250 bpm. Occasional supraventricular ectopy. Rare ventricular ectopy. No atrial fibrillation. No sustained arrhythmias.            Physical Exam:    VS:  There were no vitals taken for this visit.    Wt Readings from Last 3 Encounters:  07/09/23 179 lb 9.6 oz (81.5 kg)  06/21/23 169 lb 8.5 oz (76.9 kg)  06/03/23 169 lb 8 oz (76.9 kg)     GEN: no distress CARD: RRR, No MRG RESP: No IWOB. CTAB.      ASSESSMENT:    No diagnosis found. PLAN:    In order of problems listed above:  #VT #Near syncope Structurally normal heart. No LGE on cMR. I suspect she is having runs of rapid outflow tract VT. She has PVCs that match  the morphology of her VT. I have discussed treatment options including medical therapy and EPS with possible ablation.   Therapeutic strategies for ventricular tachycardia including medicine and ablation were discussed in detail with the patient today. Risk, benefits, and alternatives to EP study and radiofrequency ablation were also discussed in detail today. These risks include but are not limited to stroke, bleeding, vascular damage, tamponade, perforation, damage to the heart and other structures, AV block requiring pacemaker, worsening renal function, and death. The patient understands these risk and wishes to proceed.  We will therefore proceed with catheter ablation at the next available time.  She should not be driving.   Signed, Steffanie Dunn, MD, [facility], [facility] 08/06/2023 10:10 PM    Electrophysiology [facility]

## 2023-08-06 NOTE — Telephone Encounter (Signed)
Called pt to briefly review monitor results and let her know Dr. Lalla Brothers wants her to increase Diltiazem to 240 mg.  Patient states she has some 120s at home and will take 2.  Aware I am not sending  in a Rx but to let someone know to send in refills, if needed, at [facility] tomorrow. Pt advised not to drive per MD. Appt moved up to tomorrow to discuss tx plan further, pt agreeable to plan.

## 2023-08-07 ENCOUNTER — Ambulatory Visit: Payer: Medicaid Other | Admitting: Cardiology

## 2023-08-07 DIAGNOSIS — R42 Dizziness and giddiness: Secondary | ICD-10-CM

## 2023-08-07 DIAGNOSIS — I4729 Other ventricular tachycardia: Secondary | ICD-10-CM

## 2023-08-07 DIAGNOSIS — R002 Palpitations: Secondary | ICD-10-CM

## 2023-08-07 DIAGNOSIS — I493 Ventricular premature depolarization: Secondary | ICD-10-CM

## 2023-08-07 NOTE — Progress Notes (Unsigned)
Electrophysiology Office Follow up Visit Note:    Date:  08/08/2023   ID:  Marissa Barnes, DOB 09/14/1965, MRN [id number]  PCP:  [facility]  [facility] Cardiologist:  None  [facility] Electrophysiologist:  None    Interval History:     Marissa Barnes is a 57 y.o. female who presents for a follow up visit.   I met the patient when she was hospitalized for VT. She was started on cardizem with improvement in burden and discharged (October 2024). She saw Brandi in clinic 07/09/2023. At that appointment, she reported ongoing episodes of presyncope. She reported continued THC and Adderall use at that appointment. A monitor was ordered which returned showing episodes of rapid VT.  When I saw the abnormal monitor, the patient's CCB was uptitrated. She presents today to discuss other treatment options.  Today she is doing well.  She tells me that when she was wearing the monitor she forgot to take her diltiazem for several doses and experienced worsening of her symptoms.  When she pressed the button on the monitor and this corresponded to ventricular tachycardia.  She has stopped smoking marijuana altogether and has reduced her Adderall to 15 mg by mouth twice daily.       Past medical, surgical, social and family history were reviewed.  ROS:   Please see the history of present illness.    All other systems reviewed and are negative.  EKGs/Labs/Other Studies Reviewed:    The following studies were reviewed today:  08/05/2023 Zio monitor personally reviewed HR 57 - 285, average 82 bpm. 1 nonsustained SVT lasting 4 beats. 18 nonsustained VT, longest 7 seconds with an average rate of 250 bpm. Occasional supraventricular ectopy. Rare ventricular ectopy. No atrial fibrillation. No sustained arrhythmias.   Cardiac MRI reviewed from October 14 shows normal EF, no scar.         Physical Exam:    VS:  BP (!) 144/70 (BP Location: Right Arm, Patient  Position: Sitting)   Pulse 87   Ht 5\' 7"  (1.702 m)   Wt 179 lb 3.2 oz (81.3 kg)   SpO2 97%   BMI 28.07 kg/m     Wt Readings from Last 3 Encounters:  08/08/23 179 lb 3.2 oz (81.3 kg)  07/09/23 179 lb 9.6 oz (81.5 kg)  06/21/23 169 lb 8.5 oz (76.9 kg)     GEN: no distress CARD: RRR, No MRG RESP: No IWOB. CTAB.      ASSESSMENT:    1. NSVT (nonsustained ventricular tachycardia) (HCC)   2. PVC (premature ventricular contraction)   3. Palpitations    PLAN:    In order of problems listed above:  #VT #Near syncope Structurally normal heart. No LGE on cMR. I suspect she is having runs of rapid outflow tract VT. She has PVCs that match the morphology of her VT. I have discussed treatment options including medical therapy and EPS with possible ablation.   Therapeutic strategies for ventricular tachycardia including medicine and ablation were discussed in detail with the patient today. Risk, benefits, and alternatives to EP study and radiofrequency ablation were also discussed in detail today. These risks include but are not limited to stroke, bleeding, vascular damage, tamponade, perforation, damage to the heart and other structures, AV block requiring pacemaker, worsening renal function, and death. The patient understands these risk and wishes to proceed.  We will therefore proceed with catheter ablation at the next available time.  She will hold her diltiazem  for 3 days prior to the procedure.     Signed, Steffanie Dunn, MD, [facility], [facility] 08/08/2023 1:39 PM    Electrophysiology [facility]

## 2023-08-08 ENCOUNTER — Ambulatory Visit: Payer: Medicaid Other | Attending: Cardiology | Admitting: Cardiology

## 2023-08-08 ENCOUNTER — Encounter: Payer: Self-pay | Admitting: Cardiology

## 2023-08-08 VITALS — BP 144/70 | HR 87 | Ht 67.0 in | Wt 179.2 lb

## 2023-08-08 DIAGNOSIS — R002 Palpitations: Secondary | ICD-10-CM | POA: Diagnosis not present

## 2023-08-08 DIAGNOSIS — I493 Ventricular premature depolarization: Secondary | ICD-10-CM | POA: Diagnosis not present

## 2023-08-08 DIAGNOSIS — I4729 Other ventricular tachycardia: Secondary | ICD-10-CM | POA: Diagnosis not present

## 2023-08-08 NOTE — Patient Instructions (Signed)
Medication Instructions:  Your physician recommends that you continue on your current medications as directed. Please refer to the Current Medication list given to you today.  *If you need a refill on your cardiac medications before your next appointment, please call your pharmacy*  Lab Work: Please go to any [facility] location to have BMET and CBC drawn  Tests/Procedures: Your physician has recommended that you have an ablation. Catheter ablation is a medical procedure used to treat some cardiac arrhythmias (irregular heartbeats). During catheter ablation, a long, thin, flexible tube is put into a blood vessel in your groin (upper thigh), or neck. This tube is called an ablation catheter. It is then guided to your heart through the blood vessel. Radio frequency waves destroy small areas of heart tissue where abnormal heartbeats may cause an arrhythmia to start. Please see the instruction sheet given to you today.  Follow-Up: At [facility], you and your health needs are our priority.  As part of our continuing mission to provide you with exceptional heart care, we have created designated Provider Care Teams.  These Care Teams include your primary Cardiologist (physician) and Advanced Practice Providers (APPs -  Physician Assistants and Nurse Practitioners) who all work together to provide you with the care you need, when you need it.  Your next appointment:   4 weeks after your procedure

## 2023-08-12 ENCOUNTER — Ambulatory Visit: Payer: Medicaid Other | Admitting: Pulmonary Disease

## 2023-08-20 ENCOUNTER — Ambulatory Visit: Payer: Medicaid Other | Admitting: Cardiology

## 2023-08-29 ENCOUNTER — Emergency Department (HOSPITAL_COMMUNITY)
Admission: EM | Admit: 2023-08-29 | Discharge: 2023-08-29 | Disposition: A | Discharge status: Home / Self Care | Payer: Medicaid Other | Source: Home / Self Care | Attending: Emergency Medicine | Admitting: Emergency Medicine

## 2023-08-29 ENCOUNTER — Other Ambulatory Visit: Payer: Self-pay

## 2023-08-29 ENCOUNTER — Emergency Department (HOSPITAL_COMMUNITY): Payer: Medicaid Other

## 2023-08-29 ENCOUNTER — Encounter (HOSPITAL_COMMUNITY): Payer: Self-pay | Admitting: Emergency Medicine

## 2023-08-29 DIAGNOSIS — T1491XA Suicide attempt, initial encounter: Secondary | ICD-10-CM

## 2023-08-29 DIAGNOSIS — T461X2A Poisoning by calcium-channel blockers, intentional self-harm, initial encounter: Secondary | ICD-10-CM | POA: Insufficient documentation

## 2023-08-29 LAB — CBC WITH DIFFERENTIAL/PLATELET
Abs Immature Granulocytes: 0.03 10*3/uL (ref 0.00–0.07)
Basophils Absolute: 0 10*3/uL (ref 0.0–0.1)
Basophils Relative: 1 %
Eosinophils Absolute: 0.2 10*3/uL (ref 0.0–0.5)
Eosinophils Relative: 3 %
HCT: 37.5 % (ref 36.0–46.0)
Hemoglobin: 12.8 g/dL (ref 12.0–15.0)
Immature Granulocytes: 0 %
Lymphocytes Relative: 21 %
Lymphs Abs: 1.5 10*3/uL (ref 0.7–4.0)
MCH: 28.5 pg (ref 26.0–34.0)
MCHC: 34.1 g/dL (ref 30.0–36.0)
MCV: 83.5 fL (ref 80.0–100.0)
Monocytes Absolute: 0.6 10*3/uL (ref 0.1–1.0)
Monocytes Relative: 9 %
Neutro Abs: 4.7 10*3/uL (ref 1.7–7.7)
Neutrophils Relative %: 66 %
Platelets: 282 10*3/uL (ref 150–400)
RBC: 4.49 MIL/uL (ref 3.87–5.11)
RDW: 12.8 % (ref 11.5–15.5)
WBC: 7.2 10*3/uL (ref 4.0–10.5)
nRBC: 0 % (ref 0.0–0.2)

## 2023-08-29 LAB — COMPREHENSIVE METABOLIC PANEL
ALT: 23 U/L (ref 0–44)
AST: 26 U/L (ref 15–41)
Albumin: 4.2 g/dL (ref 3.5–5.0)
Alkaline Phosphatase: 117 U/L (ref 38–126)
Anion gap: 13 (ref 5–15)
BUN: 9 mg/dL (ref 6–20)
CO2: 24 mmol/L (ref 22–32)
Calcium: 9.5 mg/dL (ref 8.9–10.3)
Chloride: 103 mmol/L (ref 98–111)
Creatinine, Ser: 0.9 mg/dL (ref 0.44–1.00)
GFR, Estimated: >60 mL/min (ref >60)
Glucose, Bld: 102 mg/dL — ABNORMAL HIGH (ref 70–99)
Potassium: 3.7 mmol/L (ref 3.5–5.1)
Sodium: 140 mmol/L (ref 135–145)
Total Bilirubin: 0.7 mg/dL (ref 0.0–1.2)
Total Protein: 7 g/dL (ref 6.5–8.1)

## 2023-08-29 LAB — TROPONIN I (HIGH SENSITIVITY): Troponin I (High Sensitivity): 3 ng/L (ref <18)

## 2023-08-29 LAB — MAGNESIUM: Magnesium: 2 mg/dL (ref 1.7–2.4)

## 2023-08-29 MED ORDER — KETOROLAC TROMETHAMINE 15 MG/ML IJ SOLN
15.0000 mg | Freq: Once | INTRAMUSCULAR | Status: AC
  Administered 2023-08-29: 15 mg via INTRAMUSCULAR

## 2023-08-29 MED ORDER — KETOROLAC TROMETHAMINE 15 MG/ML IJ SOLN
15.0000 mg | Freq: Once | INTRAMUSCULAR | Status: DC
  Filled 2023-08-29: qty 1

## 2023-08-29 NOTE — Discharge Instructions (Signed)
 We discussed the recommendation from poison control regarding admission overnight until 11 AM tomorrow.  They state that you can resume your medication after 11 AM tomorrow as long as you remain asymptomatic.  However this requires monitoring you on a heart monitor in the hospital.  You stated you have pets that she need to look after.  Please return to the emergency room for any concerning symptoms.  Otherwise pick up the prescription for 120 mg diltiazem .  Call and notify your cardiologist that you are in the emergency department for this as well.

## 2023-08-29 NOTE — ED Provider Notes (Signed)
 [place] EMERGENCY DEPARTMENT AT [facility] Provider Note   CSN: [id number] Arrival date & time: 08/29/23  1555     History  Chief Complaint  Patient presents with   Ingestion    Marissa Barnes is a 58 y.o. female.   Pt complains of chest pain that occurred yesterday with palpitations.  She does have history of A-fib.  She states for the past 4 days she has been taken 360 mg of diltiazem  twice daily.  She is prescribed to take 240 mg twice daily.  She states yesterday due to the episode of palpitation she took an additional dose of 360 mg around 11 AM.  She is concerned that she has been taking too much of this medication.  She states she was previously prescribed a 180 mg dose however this was decreased to 120 mg but she ran out of the 120 mg dose 4 days ago and has been doubling up on the 180 mg dose.  The history is provided by the patient. No language interpreter was used.       Home Medications Prior to Admission medications   Medication Sig Start Date End Date Taking? Authorizing Provider  ALPRAZolam  (XANAX ) 0.5 MG tablet Take 0.5 mg by mouth 2 (two) times daily as needed for anxiety.    Yes [provider]  amphetamine -dextroamphetamine  (ADDERALL) 30 MG tablet Take 15 mg by mouth 2 (two) times daily.   Yes [provider]  diltiazem  (CARDIZEM  CD) 120 MG 24 hr capsule Take 360 mg by mouth daily.   Yes [provider]  PARoxetine  (PAXIL ) 40 MG tablet Take 40 mg by mouth daily.  09/02/18  Yes [provider]      Allergies    Patient has no known allergies.    Review of Systems   Review of Systems  Constitutional:  Negative for chills and fever.  Respiratory:  Negative for shortness of breath.   Cardiovascular:  Negative for chest pain and palpitations.  Gastrointestinal:  Negative for abdominal pain and nausea.  Neurological:  Negative for facial asymmetry and light-headedness.  All other systems reviewed and are  negative.   Physical Exam Updated Vital Signs BP (!) 152/84 (BP Location: Right Arm)   Pulse 76   Temp 98.3 F (36.8 C)   Resp 20   SpO2 97%  Physical Exam Vitals and nursing note reviewed.  Constitutional:      General: She is not in acute distress.    Appearance: Normal appearance. She is not ill-appearing.  HENT:     Head: Normocephalic and atraumatic.     Nose: Nose normal.  Eyes:     Conjunctiva/sclera: Conjunctivae normal.  Cardiovascular:     Rate and Rhythm: Normal rate and regular rhythm.  Pulmonary:     Effort: Pulmonary effort is normal. No respiratory distress.     Breath sounds: Normal breath sounds. No wheezing.  Musculoskeletal:        General: No deformity. Normal range of motion.  Skin:    Findings: No rash.  Neurological:     Mental Status: She is alert.     ED Results / Procedures / Treatments   Labs (all labs ordered are listed, but only abnormal results are displayed) Labs Reviewed  COMPREHENSIVE METABOLIC PANEL - Abnormal; Notable for the following components:      Result Value   Glucose, Bld 102 (*)    All other components within normal limits  CBC WITH DIFFERENTIAL/PLATELET  MAGNESIUM  URINALYSIS, ROUTINE W REFLEX MICROSCOPIC  TROPONIN I (HIGH SENSITIVITY)  TROPONIN I (HIGH SENSITIVITY)    EKG None  Radiology DG Chest 2 View Result Date: 08/29/2023 CLINICAL DATA:  Chest pain. EXAM: CHEST - 2 VIEW COMPARISON:  Chest radiograph dated 06/21/2023. FINDINGS: No focal consolidation, pleural effusion, pneumothorax. The cardiac silhouette is within normal limits. No acute osseous pathology. IMPRESSION: No active cardiopulmonary disease. Electronically Signed   By: Vanetta Chou M.D.   On: 08/29/2023 16:46    Procedures Procedures    Medications Ordered in ED Medications  ketorolac  (TORADOL ) 15 MG/ML injection 15 mg (has no administration in time range)    ED Course/ Medical Decision Making/ A&P Clinical Course as of 08/29/23 1834   Thu Aug 29, 2023  1736 Spoke to poison control.  Patient's last dose of diltiazem  was at 11 AM.  They recommend 24-hour monitoring and admission for observation.  They recommend cardiac telemetry during the admission for any arrhythmia and repeat electrolytes in the morning.  They state the headache that patient is experiencing could be from the increased dosage of the medicine.  They do not feel the peripheral edema is related. [AA]    Clinical Course User Index [AA] Hildegard Loge, PA-C                                 Medical Decision Making Amount and/or Complexity of Data Reviewed Labs: ordered. Radiology: ordered.  Risk Prescription drug management.   Medical Decision Making / ED Course   This patient presents to the ED for concern of concern taking increased dosage of diltiazem , this involves an extensive number of treatment options, and is a complaint that carries with it a high risk of complications and morbidity.  The differential diagnosis includes overdose, A-fib, ACS  MDM: 57 year old female presents today for concern of taking an increased dose than what she has been prescribed for diltiazem  due to running out of her prescribed dose.  She had previously prescribed 180 mg tablets that she took 2 of for the past 4 days however also took a breakthrough dose yesterday.  Her last dose today was 11 AM. As noted in ED course patient was discussed with poison control.  They recommended 24-hour observation admission.  Patient ultimately decided that she could not stay if she has pets at home that no one can look after.  She understands that she can go into an arrhythmia including V. tach which could lead to death.  Currently she is asymptomatic.  EKG without acute ischemic change.  No arrhythmia.  QT within normal.  Strict return precaution discussed.  Patient ultimately signed out AGAINST MEDICAL ADVICE.   At the time of discharge she was stable.   Lab Tests: -I ordered, reviewed,  and interpreted labs.   The pertinent results include:   Labs Reviewed  COMPREHENSIVE METABOLIC PANEL - Abnormal; Notable for the following components:      Result Value   Glucose, Bld 102 (*)    All other components within normal limits  CBC WITH DIFFERENTIAL/PLATELET  MAGNESIUM  URINALYSIS, ROUTINE W REFLEX MICROSCOPIC  TROPONIN I (HIGH SENSITIVITY)  TROPONIN I (HIGH SENSITIVITY)      EKG  EKG Interpretation Date/Time:    Ventricular Rate:    PR Interval:    QRS Duration:    QT Interval:    QTC Calculation:   R Axis:      Text Interpretation:  Imaging Studies ordered: I ordered imaging studies including cxr I independently visualized and interpreted imaging. I agree with the radiologist interpretation   Medicines ordered and prescription drug management: Meds ordered this encounter  Medications   ketorolac  (TORADOL ) 15 MG/ML injection 15 mg    -I have reviewed the patients home medicines and have made adjustments as needed   Co morbidities that complicate the patient evaluation  Past Medical History:  Diagnosis Date   Anxiety    Depression       Dispostion: Patient ultimately signed out AGAINST MEDICAL ADVICE.  She is aware she can return to the emergency department at any point.  This was not an attempt for suicide.     Final Clinical Impression(s) / ED Diagnoses Final diagnoses:  Suicide attempt [facility])    Rx / DC Orders ED Discharge Orders     None         Hildegard Loge, PA-C 08/29/23 1845    Randol Simmonds, MD 08/29/23 2233

## 2023-08-29 NOTE — ED Provider Triage Note (Signed)
 Emergency Medicine Provider Triage Evaluation Note  Marissa Barnes , a 58 y.o. female  was evaluated in triage.  Pt complains of chest pain that occurred yesterday with palpitations.  She does have history of A-fib.  She states for the past 4 days she has been taken 360 mg of diltiazem  twice daily.  She is prescribed to take 240 mg twice daily.  She states yesterday due to the episode of palpitation she took an additional dose of 360 mg around 11 AM.  She is concerned that she has been taking too much of this medication.  She states she was previously prescribed a 180 mg dose however this was decreased to 120 mg but she ran out of the 120 mg dose 4 days ago and has been doubling up on the 180 mg dose.  Currently no chest pain.  Endorses headache, peripheral edema.  States she typically does not have peripheral edema.  Review of Systems  Positive: As above Negative: As above  Physical Exam  BP (!) 152/84 (BP Location: Right Arm)   Pulse 76   Temp 98.3 F (36.8 C)   Resp 20   SpO2 97%  Gen:   Awake, no distress   Resp:  Normal effort  MSK:   Moves extremities without difficulty  Other:    Medical Decision Making  Medically screening exam initiated at 4:27 PM.  Appropriate orders placed.  Marissa Barnes was informed that the remainder of the evaluation will be completed by another provider, this initial triage assessment does not replace that evaluation, and the importance of remaining in the ED until their evaluation is complete.     Hildegard Loge, PA-C 08/29/23 1635

## 2023-08-29 NOTE — ED Triage Notes (Signed)
 Pt reports she has ablation scheduled at the end of the month. Has been prescribed 240 mg diltiazem  and ran out of the 120 mg and started taking 2 of the 180 mg instead.  Yesterday was feeling her heart fluttering and took 360 mg at a time for 3 doses.  Pt feels her blood pressure in her eyes  Has a severe headache, and legs are swollen. No chest pain at this time but had some last night.

## 2023-08-29 NOTE — Telephone Encounter (Signed)
 Called patient to see how much diltiazem  she has taken in the last 24 hours. Patient stated she took 360 mg yesterday evening, then 360 mg early this morning and then another 360 mg not long ago. Patient stated she feels somewhat better, but still has some SOB and BLE edema. Informed patient that she needs to go to the ED, that she has taken more then the max dose of diltiazem  in a 24 hour period (480 mg ). Will forward to Dr. Cindie so he is aware.

## 2023-08-30 ENCOUNTER — Encounter (HOSPITAL_COMMUNITY): Payer: Self-pay | Admitting: *Deleted

## 2023-08-30 ENCOUNTER — Telehealth: Payer: Self-pay

## 2023-08-30 ENCOUNTER — Other Ambulatory Visit: Payer: Self-pay

## 2023-08-30 ENCOUNTER — Inpatient Hospital Stay (HOSPITAL_COMMUNITY)
Admission: EM | Admit: 2023-08-30 | Discharge: 2023-09-02 | DRG: 310 | Disposition: A | Discharge status: Home / Self Care | Payer: Medicaid Other | Attending: Cardiology | Admitting: Cardiology

## 2023-08-30 DIAGNOSIS — I493 Ventricular premature depolarization: Secondary | ICD-10-CM | POA: Diagnosis present

## 2023-08-30 DIAGNOSIS — R600 Localized edema: Secondary | ICD-10-CM | POA: Diagnosis present

## 2023-08-30 DIAGNOSIS — Z5941 Food insecurity: Secondary | ICD-10-CM

## 2023-08-30 DIAGNOSIS — Z5329 Procedure and treatment not carried out because of patient's decision for other reasons: Secondary | ICD-10-CM | POA: Diagnosis present

## 2023-08-30 DIAGNOSIS — Y92009 Unspecified place in unspecified non-institutional (private) residence as the place of occurrence of the external cause: Secondary | ICD-10-CM | POA: Diagnosis not present

## 2023-08-30 DIAGNOSIS — Z83719 Family history of colon polyps, unspecified: Secondary | ICD-10-CM | POA: Diagnosis not present

## 2023-08-30 DIAGNOSIS — F418 Other specified anxiety disorders: Secondary | ICD-10-CM | POA: Diagnosis not present

## 2023-08-30 DIAGNOSIS — F909 Attention-deficit hyperactivity disorder, unspecified type: Secondary | ICD-10-CM | POA: Diagnosis present

## 2023-08-30 DIAGNOSIS — R062 Wheezing: Secondary | ICD-10-CM | POA: Diagnosis present

## 2023-08-30 DIAGNOSIS — Z96641 Presence of right artificial hip joint: Secondary | ICD-10-CM | POA: Diagnosis present

## 2023-08-30 DIAGNOSIS — T461X1A Poisoning by calcium-channel blockers, accidental (unintentional), initial encounter: Secondary | ICD-10-CM | POA: Diagnosis present

## 2023-08-30 DIAGNOSIS — I4729 Other ventricular tachycardia: Secondary | ICD-10-CM

## 2023-08-30 DIAGNOSIS — Z803 Family history of malignant neoplasm of breast: Secondary | ICD-10-CM

## 2023-08-30 DIAGNOSIS — I447 Left bundle-branch block, unspecified: Secondary | ICD-10-CM | POA: Diagnosis present

## 2023-08-30 DIAGNOSIS — Z79899 Other long term (current) drug therapy: Secondary | ICD-10-CM | POA: Diagnosis not present

## 2023-08-30 DIAGNOSIS — I472 Ventricular tachycardia, unspecified: Secondary | ICD-10-CM | POA: Diagnosis present

## 2023-08-30 DIAGNOSIS — Z833 Family history of diabetes mellitus: Secondary | ICD-10-CM | POA: Diagnosis not present

## 2023-08-30 DIAGNOSIS — Z8249 Family history of ischemic heart disease and other diseases of the circulatory system: Secondary | ICD-10-CM

## 2023-08-30 DIAGNOSIS — F419 Anxiety disorder, unspecified: Secondary | ICD-10-CM | POA: Diagnosis present

## 2023-08-30 DIAGNOSIS — Z5986 Financial insecurity: Secondary | ICD-10-CM | POA: Diagnosis not present

## 2023-08-30 DIAGNOSIS — R55 Syncope and collapse: Secondary | ICD-10-CM | POA: Diagnosis present

## 2023-08-30 DIAGNOSIS — I10 Essential (primary) hypertension: Secondary | ICD-10-CM | POA: Diagnosis not present

## 2023-08-30 HISTORY — DX: Ventricular tachycardia, unspecified: I47.20

## 2023-08-30 LAB — RAPID URINE DRUG SCREEN, HOSP PERFORMED
Amphetamines: NOT DETECTED
Barbiturates: NOT DETECTED
Benzodiazepines: POSITIVE — AB
Cocaine: NOT DETECTED
Opiates: NOT DETECTED
Tetrahydrocannabinol: POSITIVE — AB

## 2023-08-30 LAB — COMPREHENSIVE METABOLIC PANEL
ALT: 22 U/L (ref 0–44)
AST: 18 U/L (ref 15–41)
Albumin: 4 g/dL (ref 3.5–5.0)
Alkaline Phosphatase: 108 U/L (ref 38–126)
Anion gap: 10 (ref 5–15)
BUN: 8 mg/dL (ref 6–20)
CO2: 24 mmol/L (ref 22–32)
Calcium: 9.6 mg/dL (ref 8.9–10.3)
Chloride: 106 mmol/L (ref 98–111)
Creatinine, Ser: 0.85 mg/dL (ref 0.44–1.00)
GFR, Estimated: >60 mL/min (ref >60)
Glucose, Bld: 131 mg/dL — ABNORMAL HIGH (ref 70–99)
Potassium: 3.9 mmol/L (ref 3.5–5.1)
Sodium: 140 mmol/L (ref 135–145)
Total Bilirubin: 0.7 mg/dL (ref 0.0–1.2)
Total Protein: 6.8 g/dL (ref 6.5–8.1)

## 2023-08-30 LAB — CBC
HCT: 37.1 % (ref 36.0–46.0)
Hematocrit: 39.2 % (ref 34.0–46.6)
Hemoglobin: 13.2 g/dL (ref 11.1–15.9)
Hemoglobin: 12.6 g/dL (ref 12.0–15.0)
MCH: 28.3 pg (ref 26.6–33.0)
MCH: 28.7 pg (ref 26.0–34.0)
MCHC: 33.7 g/dL (ref 31.5–35.7)
MCHC: 34 g/dL (ref 30.0–36.0)
MCV: 84 fL (ref 79–97)
MCV: 84.5 fL (ref 80.0–100.0)
Platelets: 318 10*3/uL (ref 150–450)
Platelets: 266 10*3/uL (ref 150–400)
RBC: 4.66 x10E6/uL (ref 3.77–5.28)
RBC: 4.39 MIL/uL (ref 3.87–5.11)
RDW: 13.1 % (ref 11.7–15.4)
RDW: 12.8 % (ref 11.5–15.5)
WBC: 6.6 10*3/uL (ref 3.4–10.8)
WBC: 6.5 10*3/uL (ref 4.0–10.5)
nRBC: 0 % (ref 0.0–0.2)

## 2023-08-30 LAB — URINALYSIS, ROUTINE W REFLEX MICROSCOPIC
Bacteria, UA: NONE SEEN
Bilirubin Urine: NEGATIVE
Glucose, UA: NEGATIVE mg/dL
Ketones, ur: NEGATIVE mg/dL
Nitrite: NEGATIVE
Protein, ur: NEGATIVE mg/dL
Specific Gravity, Urine: 1.01 (ref 1.005–1.030)
pH: 7 (ref 5.0–8.0)

## 2023-08-30 LAB — RESPIRATORY PANEL BY PCR

## 2023-08-30 LAB — TROPONIN I (HIGH SENSITIVITY)
Troponin I (High Sensitivity): 3 ng/L (ref <18)
Troponin I (High Sensitivity): 4 ng/L (ref <18)
Troponin I (High Sensitivity): 4 ng/L (ref <18)

## 2023-08-30 LAB — BASIC METABOLIC PANEL
BUN/Creatinine Ratio: 15 (ref 9–23)
BUN: 12 mg/dL (ref 6–24)
CO2: 22 mmol/L (ref 20–29)
Calcium: 9.9 mg/dL (ref 8.7–10.2)
Chloride: 101 mmol/L (ref 96–106)
Creatinine, Ser: 0.78 mg/dL (ref 0.57–1.00)
Glucose: 99 mg/dL (ref 70–99)
Potassium: 4.4 mmol/L (ref 3.5–5.2)
Sodium: 144 mmol/L (ref 134–144)
eGFR: 89 mL/min/{1.73_m2} (ref >59)

## 2023-08-30 LAB — RESP PANEL BY RT-PCR (RSV, FLU A&B, COVID)  RVPGX2
Influenza A by PCR: NEGATIVE
Influenza B by PCR: NEGATIVE
Resp Syncytial Virus by PCR: NEGATIVE
SARS Coronavirus 2 by RT PCR: NEGATIVE

## 2023-08-30 LAB — MRSA NEXT GEN BY PCR, NASAL: MRSA by PCR Next Gen: NOT DETECTED

## 2023-08-30 LAB — BRAIN NATRIURETIC PEPTIDE: B Natriuretic Peptide: 15.7 pg/mL (ref 0.0–100.0)

## 2023-08-30 LAB — MAGNESIUM: Magnesium: 2.2 mg/dL (ref 1.7–2.4)

## 2023-08-30 LAB — LIPASE, BLOOD: Lipase: 33 U/L (ref 11–51)

## 2023-08-30 MED ORDER — DILTIAZEM HCL-DEXTROSE 125-5 MG/125ML-% IV SOLN (PREMIX)
5.0000 mg/h | INTRAVENOUS | Status: DC
  Administered 2023-08-30 – 2023-09-01 (×2): 5 mg/h via INTRAVENOUS
  Filled 2023-08-30 (×3): qty 125

## 2023-08-30 MED ORDER — ALPRAZOLAM 0.25 MG PO TABS
0.2500 mg | ORAL_TABLET | Freq: Two times a day (BID) | ORAL | Status: DC | PRN
  Administered 2023-08-30 – 2023-08-31 (×3): 0.25 mg via ORAL
  Filled 2023-08-30 (×4): qty 1

## 2023-08-30 MED ORDER — ONDANSETRON HCL 4 MG/2ML IJ SOLN: 4.0000 mg | Freq: Four times a day (QID) | INTRAMUSCULAR | Status: DC | PRN

## 2023-08-30 MED ORDER — ACETAMINOPHEN 325 MG PO TABS
650.0000 mg | ORAL_TABLET | ORAL | Status: DC | PRN
  Administered 2023-08-31 – 2023-09-01 (×2): 650 mg via ORAL
  Filled 2023-08-30 (×2): qty 2

## 2023-08-30 MED ORDER — PAROXETINE HCL 20 MG PO TABS
40.0000 mg | ORAL_TABLET | Freq: Every day | ORAL | Status: DC
  Administered 2023-08-31 – 2023-09-02 (×3): 40 mg via ORAL
  Filled 2023-08-30 (×3): qty 2

## 2023-08-30 MED ORDER — ALPRAZOLAM 0.25 MG PO TABS
0.2500 mg | ORAL_TABLET | Freq: Once | ORAL | Status: AC
  Administered 2023-08-30: 0.25 mg via ORAL

## 2023-08-30 NOTE — ED Notes (Signed)
 2C is ready for patient per Dewayne Hatch.

## 2023-08-30 NOTE — ED Notes (Signed)
 Ambulatory to restroom

## 2023-08-30 NOTE — Telephone Encounter (Signed)
 Spoke with the patient and advised her to return to the ER. She is very hesitant because she does not have anyone to take care of her pets and house. Advised that it is dangerous for her to stay at home alone with her symptoms. Patient stated that she is agreeable to go back to the ER but has to work out a few things first.

## 2023-08-30 NOTE — ED Notes (Signed)
 ED TO INPATIENT HANDOFF REPORT  ED Nurse Name and Phone #: Albino [phone]  S Name/Age/Gender Marissa Barnes 58 y.o. female Room/Bed: 005C/005C  Code Status   Code Status: Full Code  Home/SNF/Other Home Patient oriented to: self, place, time, and situation Is this baseline? Yes   Triage Complete: Triage complete  Chief Complaint Ventricular tachycardia (HCC) [I47.20]  Triage Note The pt took cardizem  1061mg  day before yesterday  she was seen last pm  and they wanted to admit her  she left anyway and would not stay  she was told by her doctor to come here  for observation.  No symptoms   Allergies No Known Allergies  Level of Care/Admitting Diagnosis ED Disposition     ED Disposition  Admit   Condition  --   Comment  Hospital Area: [facility] [[id number]]  Level of Care: Telemetry Cardiac [103]  May admit patient to [facility] or [facility] if equivalent level of care is available:: No  Covid Evaluation: Asymptomatic - no recent exposure (last 10 days) testing not required  Diagnosis: Ventricular tachycardia [facility]) [[id number]]  Admitting Physician: SANTO STANLY LABOR [[id number]]  Attending Physician: SANTO STANLY LABOR [[id number]]  Certification:: I certify this patient will need inpatient services for at least 2 midnights  Expected Medical Readiness: 09/03/2023          B Medical/Surgery History Past Medical History:  Diagnosis Date   Anxiety    Depression    VT (ventricular tachycardia) (HCC)    Past Surgical History:  Procedure Laterality Date   KNEE ARTHROSCOPY     LIPOSUCTION     TONSILECTOMY, ADENOIDECTOMY, BILATERAL MYRINGOTOMY AND TUBES  2010   TOTAL HIP ARTHROPLASTY Right 05/20/2019   Procedure: TOTAL HIP ARTHROPLASTY ANTERIOR APPROACH;  Surgeon: Melodi Lerner, MD;  Location: [facility];  Service: Orthopedics;  Laterality: Right;    TUBAL LIGATION  2006     A IV Location/Drains/Wounds Patient Lines/Drains/Airways  Status     Active Line/Drains/Airways     None            Intake/Output Last 24 hours No intake or output data in the 24 hours ending 08/30/23 2020  Labs/Imaging Results for orders placed or performed during the hospital encounter of 08/30/23 (from the past 48 hours)  Lipase, blood     Status: None   Collection Time: 08/30/23  5:08 PM  Result Value Ref Range   Lipase 33 11 - 51 U/L    Comment: Performed at [facility], [address], [place], [place] [address]  Comprehensive metabolic panel     Status: Abnormal   Collection Time: 08/30/23  5:08 PM  Result Value Ref Range   Sodium 140 135 - 145 mmol/L   Potassium 3.9 3.5 - 5.1 mmol/L   Chloride 106 98 - 111 mmol/L   CO2 24 22 - 32 mmol/L   Glucose, Bld 131 (H) 70 - 99 mg/dL    Comment: Glucose reference range applies only to samples taken after fasting for at least 8 hours.   BUN 8 6 - 20 mg/dL   Creatinine, Ser 9.14 0.44 - 1.00 mg/dL   Calcium  9.6 8.9 - 10.3 mg/dL   Total Protein 6.8 6.5 - 8.1 g/dL   Albumin 4.0 3.5 - 5.0 g/dL   AST 18 15 - 41 U/L   ALT 22 0 - 44 U/L   Alkaline Phosphatase 108 38 - 126 U/L   Total Bilirubin 0.7 0.0 -  1.2 mg/dL   GFR, Estimated >39 >39 mL/min    Comment: (NOTE) Calculated using the CKD-EPI Creatinine Equation (2021)    Anion gap 10 5 - 15    Comment: Performed at [facility], [address], [place], [place] [address]  CBC     Status: None   Collection Time: 08/30/23  5:08 PM  Result Value Ref Range   WBC 6.5 4.0 - 10.5 K/uL   RBC 4.39 3.87 - 5.11 MIL/uL   Hemoglobin 12.6 12.0 - 15.0 g/dL   HCT 62.8 63.9 - 53.9 %   MCV 84.5 80.0 - 100.0 fL   MCH 28.7 26.0 - 34.0 pg   MCHC 34.0 30.0 - 36.0 g/dL   RDW 87.1 88.4 - 84.4 %   Platelets 266 150 - 400 K/uL   nRBC 0.0 0.0 - 0.2 %    Comment: Performed at [facility], [address] [place], [place] [address]  Magnesium     Status: None   Collection Time: 08/30/23  6:54 PM  Result Value Ref Range   Magnesium  2.2 1.7 - 2.4 mg/dL    Comment: Performed at [facility], [address] [place], [place] [address]  Troponin I (High Sensitivity)     Status: None   Collection Time: 08/30/23  6:54 PM  Result Value Ref Range   Troponin I (High Sensitivity) 4 <18 ng/L    Comment: (NOTE) Elevated high sensitivity troponin I (hsTnI) values and significant  changes across serial measurements may suggest ACS but many other  chronic and acute conditions are known to elevate hsTnI results.  Refer to the Links section for chest pain algorithms and additional  guidance. Performed at [facility], [address] [place], [place] [address]    DG Chest 2 View Result Date: 08/29/2023 CLINICAL DATA:  Chest pain. EXAM: CHEST - 2 VIEW COMPARISON:  Chest radiograph dated 06/21/2023. FINDINGS: No focal consolidation, pleural effusion, pneumothorax. The cardiac silhouette is within normal limits. No acute osseous pathology. IMPRESSION: No active cardiopulmonary disease. Electronically Signed   By: Vanetta Chou M.D.   On: 08/29/2023 16:46    Pending Labs Unresulted Labs (From admission, onward)     Start     Ordered   08/30/23 1939  Resp panel by RT-PCR (RSV, Flu A&B, Covid) Anterior Nasal Swab  Once,   R        08/30/23 1938   08/30/23 1929  Respiratory (~20 pathogens) panel by PCR  (Respiratory panel by PCR (~20 pathogens, ~24 hr TAT)  w precautions)  Once,   R        08/30/23 1928   08/30/23 1917  Brain natriuretic peptide  Add-on,   AD        08/30/23 1917   08/30/23 1820  Rapid urine drug screen (hospital performed)  ONCE - STAT,   STAT        08/30/23 1819   08/30/23 1708  Urinalysis, Routine w reflex microscopic -Urine, Clean Catch  Once,   URGENT       Question:  Specimen Source  Answer:  Urine, Clean Catch   08/30/23 1708   Signed and Held  Lipid panel  Tomorrow morning,   R        Signed and Held   Signed and Held  Basic metabolic panel  Tomorrow morning,   R        Signed and Held    Signed and  Held  CBC  Tomorrow morning,   R        Signed and Held            Vitals/Pain Today's Vitals   08/30/23 1850 08/30/23 1900 08/30/23 1930 08/30/23 2000  BP: (!) 147/80 (!) 146/84 (!) 142/87 130/89  Pulse: 84 70 68 76  Resp: 17 16 18 19   Temp:      TempSrc:      SpO2: 99% 94% 94% 98%  Weight:      Height:      PainSc:        Isolation Precautions Droplet precaution  Medications Medications - No data to display  Mobility walks     Focused Assessments Neuro Assessment Handoff:  Swallow screen pass? Yes          Neuro Assessment:   Neuro Checks:      Has TPA been given? No If patient is a Neuro Trauma and patient is going to OR before floor call report to 4N Charge nurse: [phone] or [phone]   R Recommendations: See Admitting Provider Note  Report given to:   Additional Notes: na

## 2023-08-30 NOTE — ED Triage Notes (Signed)
 The pt took cardizem 1061mg  day before yesterday  she was seen last pm  and they wanted to admit her  she left anyway and would not stay  she was told by her doctor to come here  for observation.  No symptoms

## 2023-08-30 NOTE — Telephone Encounter (Signed)
 Left message for patient to call back.  Per Dr. Lalla Brothers she needs to return to the ER for admission.

## 2023-08-30 NOTE — ED Provider Notes (Signed)
 [place] [facility]GENCY DEPARTMENT AT [facility] Provider Note   CSN: [id number] Arrival date & time: 08/30/23  1640     History  Chief Complaint  Patient presents with   Drug Overdose    Marissa Barnes is a 58 y.o. female, history of V. tach, who presents to the ED secondary to being told by her doctor to come back to the ER.  She states she took 1080 mg of Cardizem  in a 24 hours period with last dose around 11 AM yesterday, and was told to stay, by poison control, however she decided to leave.  She states her doctor called her twice today, and told her to come back in to get further evaluation and observation.  She states that her last episode of chest pain, shortness of breath, and tachycardia was around 8 PM yesterday.  Has not had any episodes since then.  Has not taken her Cardizem  since 11 AM yesterday.  She states that she is on it for V. tach.  She states that she was originally supposed to be taking only 240 mg twice daily Cardizem  daily, but was taking 360 mg of Cardizem  twice daily, because she was out of the 120 mg.  She notes that she took an extra 2 yesterday, because she felt like she was having more frequent episodes of V. tach with shortness of breath, chest discomfort, and a presyncopal feeling. She denies any current symptoms.   Home Medications Prior to Admission medications   Medication Sig Start Date End Date Taking? Authorizing Provider  ALPRAZolam  (XANAX ) 0.5 MG tablet Take 0.5 mg by mouth 2 (two) times daily as needed for anxiety.     [provider]  amphetamine -dextroamphetamine  (ADDERALL) 30 MG tablet Take 15 mg by mouth 2 (two) times daily.    [provider]  diltiazem  (CARDIZEM  CD) 120 MG 24 hr capsule Take 360 mg by mouth daily.    [provider]  PARoxetine  (PAXIL ) 40 MG tablet Take 40 mg by mouth daily.  09/02/18   [provider]      Allergies    Patient has no known allergies.    Review of Systems    Review of Systems  Respiratory:  Negative for shortness of breath.   Cardiovascular:  Positive for chest pain.    Physical Exam Updated Vital Signs BP (!) 146/84   Pulse 70   Temp 98.4 F (36.9 C) (Oral)   Resp 16   Ht 5' 7 (1.702 m)   Wt 81.3 kg   SpO2 94%   BMI 28.07 kg/m  Physical Exam Vitals and nursing note reviewed.  Constitutional:      General: She is not in acute distress.    Appearance: She is well-developed.  HENT:     Head: Normocephalic and atraumatic.  Eyes:     Conjunctiva/sclera: Conjunctivae normal.  Cardiovascular:     Rate and Rhythm: Normal rate and regular rhythm.     Heart sounds: No murmur heard. Pulmonary:     Effort: Pulmonary effort is normal. No respiratory distress.     Breath sounds: Normal breath sounds.  Abdominal:     Palpations: Abdomen is soft.     Tenderness: There is no abdominal tenderness.  Musculoskeletal:        General: No swelling.     Cervical back: Neck supple.  Skin:    General: Skin is warm and dry.     Capillary Refill: Capillary refill takes less than 2  seconds.  Neurological:     Mental Status: She is alert.  Psychiatric:        Mood and Affect: Mood normal.     ED Results / Procedures / Treatments   Labs (all labs ordered are listed, but only abnormal results are displayed) Labs Reviewed  COMPREHENSIVE METABOLIC PANEL - Abnormal; Notable for the following components:      Result Value   Glucose, Bld 131 (*)    All other components within normal limits  RESPIRATORY PANEL BY PCR  RESP PANEL BY RT-PCR (RSV, FLU A&B, COVID)  RVPGX2  LIPASE, BLOOD  CBC  URINALYSIS, ROUTINE W REFLEX MICROSCOPIC  RAPID URINE DRUG SCREEN, HOSP PERFORMED  MAGNESIUM  BRAIN NATRIURETIC PEPTIDE  TROPONIN I (HIGH SENSITIVITY)    EKG None  Radiology DG Chest 2 View Result Date: 08/29/2023 CLINICAL DATA:  Chest pain. EXAM: CHEST - 2 VIEW COMPARISON:  Chest radiograph dated 06/21/2023. FINDINGS: No focal consolidation, pleural  effusion, pneumothorax. The cardiac silhouette is within normal limits. No acute osseous pathology. IMPRESSION: No active cardiopulmonary disease. Electronically Signed   By: Vanetta Chou M.D.   On: 08/29/2023 16:46    Procedures Procedures    Medications Ordered in ED Medications - No data to display  ED Course/ Medical Decision Making/ A&P                               Medical Decision Making Patient is a 58 year old female, here for further evaluation.  She left AGAINST MEDICAL ADVICE yesterday, after taking too much Cardizem , to help control her VT.  She is back for this.  I spoke with poison control, she is cleared from their perspective.  I spoke with the cardiology PA Angie, and she states that the patient should be admitted, given concern for recurrent VT, need for ablation.  Tropes ordered, blood work ordered.  Admitted to cardiology.  Amount and/or Complexity of Data Reviewed Labs:  Decision-making details documented in ED Course.  Risk Decision regarding hospitalization.   Final Clinical Impression(s) / ED Diagnoses Final diagnoses:  PVC (premature ventricular contraction)  NSVT (nonsustained ventricular tachycardia) (HCC)  Syncope, unspecified syncope type    Rx / DC Orders ED Discharge Orders     None         Philippa, Lyle CROME, PA 08/30/23 1939    Armenta Canning, MD 08/30/23 2159

## 2023-08-30 NOTE — H&P (Addendum)
 Cardiology Admission History and Physical   Patient ID: AVO SCHLACHTER MRN: [id number]; DOB: 1966/04/16   Admission date: 08/30/2023  PCP:  [facility]   [facility] Providers Cardiologist:  OLE ONEIDA HOLTS, MD        Chief Complaint:  VT  Patient Profile:   Marissa Barnes is a 58 y.o. female with VT, THC use, ADHD, anxiety, and pre-syncope who is being seen 08/30/2023 for the evaluation of VT.  History of Present Illness:   Marissa Barnes was hospitalized Oct 2024 with ventricular tachycardia.  She was started on Cardizem  with improvement in her burden and discharged.    Initial workup included echocardiogram 05/2023 which showed normal biventricular function and no significant valvular disease.  Cardiac MRI 05/2023 confirmed normal biventricular function and no delayed myocardial enhancement, no findings suggestive of scar, fibrosis, infiltrative or inflammatory process.  When seen back in EP clinic 07/09/2023, she reported ongoing episodes of presyncope.  She continues to use THC nightly for anxiety and Adderall.  Heart monitor was placed which returned episodes of rapid VT.  After heart monitor results reviewed, her calcium  channel blocker was increased.  However she reported forgetting several doses of Cardizem  while wearing the monitor with worsening of her symptoms.  She stopped smoking marijuana and reduced her Adderall to 15 mg by mouth twice daily.  Given ongoing symptomatic VT not controlled by CCB, PVC ablation was discussed.  Dr. Holts suspected runs of rapid outflow tract VT, she has PVCs that match the morphology of her VT.  Of note, she does not have a documented history of A-fib and does not need anticoagulation.  She presented to [facility] ED yesterday, 08/29/2023, after taking extra doses of Cardizem  trying to control heart palpitations. She was supposed to take 240 mg cardizem  daily and she planned to take 2-120 mg tablets. However, she ran out  and was taking 2-180 mg tablets.  It is unclear how many total milligrams of Cardizem  was consumed.  Poison control was contacted who recommended 24-hour observation.  However due to having pets at home, the patient left AMA.  She was contacted by Dr. Holts today and advised to return to the ER for admission and possible PVC ablation.  On arrival, basic labs WNL (K 3.9) UDS pending.  Cardiology was consulted for admission. She has not taken cardizem  today. She took xanax  last night.  Last smoked marijuana 08/14/23. She is taking 15 mg adderall BID, last taken yesterday morning.   She is congested with mild wheezing, will get respiratory panel.   Past Medical History:  Diagnosis Date   Anxiety    Depression    VT (ventricular tachycardia) (HCC)     Past Surgical History:  Procedure Laterality Date   KNEE ARTHROSCOPY     LIPOSUCTION     TONSILECTOMY, ADENOIDECTOMY, BILATERAL MYRINGOTOMY AND TUBES  2010   TOTAL HIP ARTHROPLASTY Right 05/20/2019   Procedure: TOTAL HIP ARTHROPLASTY ANTERIOR APPROACH;  Surgeon: Melodi Lerner, MD;  Location: [facility];  Service: Orthopedics;  Laterality: Right;    TUBAL LIGATION  2006     Medications Prior to Admission: Prior to Admission medications   Medication Sig Start Date End Date Taking? Authorizing Provider  ALPRAZolam  (XANAX ) 0.5 MG tablet Take 0.5 mg by mouth 2 (two) times daily as needed for anxiety.     [provider]  amphetamine -dextroamphetamine  (ADDERALL) 30 MG tablet Take 15 mg by mouth 2 (two) times daily.  [provider]  diltiazem  (CARDIZEM  CD) 120 MG 24 hr capsule Take 360 mg by mouth daily.    [provider]  PARoxetine  (PAXIL ) 40 MG tablet Take 40 mg by mouth daily.  09/02/18   [provider]     Allergies:   No Known Allergies  Social History:   Social History   Socioeconomic History   Marital status: Divorced    Spouse name: Not on file   Number of children: Not on file    Years of education: Not on file   Highest education level: Not on file  Occupational History   Not on file  Tobacco Use   Smoking status: Never   Smokeless tobacco: Never  Vaping Use   Vaping status: Never Used  Substance and Sexual Activity   Alcohol use: Yes    Comment: occ   Drug use: Yes    Frequency: 1.0 times per week    Types: Marijuana   Sexual activity: Not Currently    Birth control/protection: Surgical  Other Topics Concern   Not on file  Social History Narrative   Not on file   Social Drivers of Health   Financial Resource Strain: Medium Risk (05/29/2023)   Received from [facility]   Overall Financial Resource Strain (CARDIA)    Difficulty of Paying Living Expenses: Somewhat hard  Food Insecurity: No Food Insecurity (05/31/2023)   Hunger Vital Sign    Worried About Running Out of Food in the Last Year: Never true    Ran Out of Food in the Last Year: Never true  Recent Concern: Food Insecurity - Food Insecurity Present (05/29/2023)   Received from [facility]   Hunger Vital Sign    Worried About Running Out of Food in the Last Year: Often true    Ran Out of Food in the Last Year: Often true  Transportation Needs: No Transportation Needs (05/31/2023)   PRAPARE - Administrator, Civil Service (Medical): No    Lack of Transportation (Non-Medical): No  Physical Activity: Sufficiently Active (05/29/2023)   Received from [facility]   Exercise Vital Sign    Days of Exercise per Week: 5 days    Minutes of Exercise per Session: 150+ min  Stress: No Stress Concern Present (05/29/2023)   Received from [facility] - Occupational Stress Questionnaire    Feeling of Stress : Only a little  Social Connections: Moderately Integrated (05/29/2023)   Received from [facility]   Social Network    How would you rate your social network (family, work, friends)?: Adequate participation with social networks   Intimate Partner Violence: Not At Risk (05/31/2023)   Humiliation, Afraid, Rape, and Kick questionnaire    Fear of Current or Ex-Partner: No    Emotionally Abused: No    Physically Abused: No    Sexually Abused: No    Family History:   The patient's family history includes Amblyopia in her mother; Atrial fibrillation in her mother; Breast cancer in her mother and sister; Colon polyps in her mother; Diabetes in her mother; Heart disease in her father. There is no history of Colon cancer, Esophageal cancer, Stomach cancer, or Rectal cancer.    ROS:  Please see the history of present illness.  All other ROS reviewed and negative.     Physical Exam/Data:   Vitals:   08/30/23 1652 08/30/23 1704 08/30/23 1850 08/30/23 1900  BP: (!) 148/82  (!) 147/80 ROLLEN)  146/84  Pulse: 77  84 70  Resp: 18  17 16   Temp: 98.4 F (36.9 C)     TempSrc: Oral     SpO2: 100%  99% 94%  Weight:  81.3 kg    Height:  5' 7 (1.702 m)     No intake or output data in the 24 hours ending 08/30/23 1933    08/30/2023    5:04 PM 08/08/2023    1:13 PM 07/09/2023    8:48 AM  Last 3 Weights  Weight (lbs) 179 lb 3.7 oz 179 lb 3.2 oz 179 lb 9.6 oz  Weight (kg) 81.3 kg 81.285 kg 81.466 kg     Body mass index is 28.07 kg/m.  General:  Well nourished, well developed, in no acute distress  HEENT: normal Neck: no JVD Vascular: No carotid bruits; Distal pulses 2+ bilaterally   Cardiac:  normal S1, S2; RRR; no murmur  Lungs:  wheezing R > L Abd: soft, nontender, no hepatomegaly  Ext: no edema Musculoskeletal:  No deformities, BUE and BLE strength normal and equal Skin: warm and dry  Neuro:  CNs 2-12 intact, no focal abnormalities noted Psych:  Normal affect    EKG:  The ECG that was done  was personally reviewed and demonstrates sinus rhythm with HR 62 - today EKG is pending   Relevant CV Studies:  Cardiac Studies & Procedures      ECHOCARDIOGRAM  ECHOCARDIOGRAM COMPLETE  06/01/2023  Narrative ECHOCARDIOGRAM REPORT    Patient Name:   DEVYNE HAUGER Date of Exam: 06/01/2023 Medical Rec #:  [id number]       Height:       67.0 in Accession #:    [id number]      Weight:       178.4 lb Date of Birth:  [birth year]       BSA:          1.926 m Patient Age:    57 years        BP:           115/80 mmHg Patient Gender: F               HR:           85 bpm. Exam Location:  Inpatient  Procedure: 2D Echo, Color Doppler, Cardiac Doppler and Intracardiac Opacification Agent  Indications:    Ventricular Tachycardia  History:        Patient has no prior history of Echocardiogram examinations.  Sonographer:    Logan Shove RDCS Referring Phys: [phone] HAO MENG   Sonographer Comments: Suboptimal subcostal window and suboptimal apical window. Image acquisition challenging due to patient body habitus. IMPRESSIONS   1. Left ventricular ejection fraction, by estimation, is 60 to 65%. The left ventricle has normal function. The left ventricle has no regional wall motion abnormalities. Left ventricular diastolic parameters were normal. 2. Right ventricular systolic function is normal. The right ventricular size is normal. Tricuspid regurgitation signal is inadequate for assessing PA pressure. 3. The mitral valve is normal in structure. No evidence of mitral valve regurgitation. No evidence of mitral stenosis. 4. The aortic valve is tricuspid. Aortic valve regurgitation is not visualized. No aortic stenosis is present. 5. The inferior vena cava is normal in size with greater than 50% respiratory variability, suggesting right atrial pressure of 3 mmHg.  Comparison(s): No prior Echocardiogram.  FINDINGS Left Ventricle: Left ventricular ejection fraction, by estimation, is 60 to 65%. The left ventricle has normal function.  The left ventricle has no regional wall motion abnormalities. Definity  contrast agent was given IV to delineate the left ventricular endocardial borders. The  left ventricular internal cavity size was normal in size. There is no left ventricular hypertrophy. Left ventricular diastolic parameters were normal.  Right Ventricle: The right ventricular size is normal. No increase in right ventricular wall thickness. Right ventricular systolic function is normal. Tricuspid regurgitation signal is inadequate for assessing PA pressure.  Left Atrium: Left atrial size was normal in size.  Right Atrium: Right atrial size was normal in size.  Pericardium: There is no evidence of pericardial effusion.  Mitral Valve: The mitral valve is normal in structure. No evidence of mitral valve regurgitation. No evidence of mitral valve stenosis.  Tricuspid Valve: The tricuspid valve is normal in structure. Tricuspid valve regurgitation is trivial. No evidence of tricuspid stenosis.  Aortic Valve: The aortic valve is tricuspid. Aortic valve regurgitation is not visualized. No aortic stenosis is present. Aortic valve mean gradient measures 5.0 mmHg. Aortic valve peak gradient measures 8.2 mmHg. Aortic valve area, by VTI measures 1.51 cm.  Pulmonic Valve: The pulmonic valve was normal in structure. Pulmonic valve regurgitation is mild. No evidence of pulmonic stenosis.  Aorta: The aortic root and ascending aorta are structurally normal, with no evidence of dilitation.  Venous: The inferior vena cava is normal in size with greater than 50% respiratory variability, suggesting right atrial pressure of 3 mmHg.  IAS/Shunts: The atrial septum is grossly normal.   LEFT VENTRICLE PLAX 2D LVIDd:         4.50 cm      Diastology LVIDs:         3.00 cm      LV e' medial:    8.27 cm/s LV PW:         0.70 cm      LV E/e' medial:  8.8 LV IVS:        0.80 cm      LV e' lateral:   9.79 cm/s LVOT diam:     1.60 cm      LV E/e' lateral: 7.5 LV SV:         48 LV SV Index:   25 LVOT Area:     2.01 cm  LV Volumes (MOD) LV vol d, MOD A2C: 88.7 ml LV vol d, MOD A4C: 102.0 ml LV  vol s, MOD A2C: 27.0 ml LV vol s, MOD A4C: 29.1 ml LV SV MOD A2C:     61.7 ml LV SV MOD A4C:     102.0 ml LV SV MOD BP:      66.7 ml  RIGHT VENTRICLE             IVC RV Basal diam:  2.70 cm     IVC diam: 1.70 cm RV S prime:     12.50 cm/s TAPSE (M-mode): 2.6 cm  LEFT ATRIUM             Index        RIGHT ATRIUM           Index LA diam:        3.40 cm 1.77 cm/m   RA Area:     11.40 cm LA Vol (A2C):   51.9 ml 26.94 ml/m  RA Volume:   25.10 ml  13.03 ml/m LA Vol (A4C):   43.6 ml 22.64 ml/m LA Biplane Vol: 48.9 ml 25.39 ml/m AORTIC VALVE AV Area (Vmax):    1.53 cm  AV Area (Vmean):   1.40 cm AV Area (VTI):     1.51 cm AV Vmax:           143.00 cm/s AV Vmean:          106.000 cm/s AV VTI:            0.321 m AV Peak Grad:      8.2 mmHg AV Mean Grad:      5.0 mmHg LVOT Vmax:         109.00 cm/s LVOT Vmean:        73.800 cm/s LVOT VTI:          0.241 m LVOT/AV VTI ratio: 0.75  AORTA Ao Asc diam: 3.10 cm  MITRAL VALVE MV Area (PHT): 3.53 cm     SHUNTS MV Decel Time: 215 msec     Systemic VTI:  0.24 m MV E velocity: 73.00 cm/s   Systemic Diam: 1.60 cm MV A velocity: 110.00 cm/s MV E/A ratio:  0.66  Sunit Tolia Electronically signed by [employer] Signature Date/Time: 06/01/2023/2:55:22 PM    Final   MONITORS  LONG TERM MONITOR (3-14 DAYS) 08/05/2023  Narrative HR 57 - 285, average 82 bpm. 1 nonsustained SVT lasting 4 beats. 18 nonsustained VT, longest 7 seconds with an average rate of 250 bpm. Occasional supraventricular ectopy. Rare ventricular ectopy. No atrial fibrillation. No sustained arrhythmias.  Appointment made for patient to discuss EP study and ablation.  Ole T. Cindie, MD, [facility], [facility] Cardiac Electrophysiology   CARDIAC MRI  MR CARDIAC MORPHOLOGY W WO CONTRAST 06/03/2023  Narrative CLINICAL DATA:  Ventricular tachycardia (VT), nonsustained  EXAM: MR CARDIA MORPHOLOGY WITHOUT AND WITH CONTRAST; MR CARDIAC VELOCITY FLOW  MAPPING  TECHNIQUE: The patient was scanned on a 1.5 Tesla Siemens magnet. A dedicated cardiac coil was used. Functional imaging was done using TrueFisp sequences. 2,3, and 4 chamber views were done to assess for RWMA's. Modified Simpson's rule using a short axis stack was used to calculate an ejection fraction on a dedicated work [employer]. The patient received 10mL GADAVIST  GADOBUTROL  1 MMOL/ML IV SOLN. After 10 minutes inversion recovery sequences were used to assess for infiltration and scar tissue. Phase contrast velocity encoded images obtained x 2.  This examination is tailored for evaluation cardiac anatomy and function and provides very limited assessment of noncardiac structures, which are accordingly not evaluated during interpretation. If there is clinical concern for extracardiac pathology, further evaluation with CT imaging should be considered.  FINDINGS: LEFT VENTRICLE:  Normal left ventricular chamber size.  Normal left ventricular wall thickness.  Normal left ventricular systolic function.  LVEF = 55%  There are no regional wall motion abnormalities.  No myocardial edema is seen visually, unable to calculate T2 map.  Normal first pass perfusion.  There is no post contrast delayed myocardial enhancement.  Normal T1 myocardial nulling kinetics suggest against a diagnosis of cardiac amyloidosis.  ECV = 29%, nonspecific elevation  RIGHT VENTRICLE:  Normal right ventricular chamber size.  Normal right ventricular wall thickness.  Normal right ventricular systolic function.  RVEF = 54%  There are no regional wall motion abnormalities.  No post contrast delayed myocardial enhancement.  ATRIA:  Left atrium: normal  Right atrium: normal  VALVES:  No significant valvular abnormalities.  PERICARDIUM:  Normal pericardium.  Trace pericardial effusion.  OTHER: No significant extracardiac findings.  MEASUREMENTS: Qp/Qs:  0.94  Aortic valve regurgitation: Trivial, regurgitant fraction 1%  Pulmonary valve regurgitation: trivial, regurgitant fraction  3%  Mitral valve regurgitation: trivial, regurgitant fraction 5%  Tricuspid valve regurgitation: mild, regurgitant fraction 11%  Left ventricle:  LV female  LV EF: 55 % (Normal 52-79%)  Absolute volumes:  LV EDV: (Normal 78-167 mL)  LV ESV: 49mL (Normal 21-64 mL)  LV SV: 60mL (Normal 52-114 mL)  CO: 4.0L/min (normal 2.7-6.3 L/min)  Indexed volumes:  LV EDV: 73mL/sq-m (Normal 50-96 mL/sq-m)  LV ESV: 42mL/sq-m (Normal 10-40 mL/sq-m)  LV SV: 50mL/sq-m (Normal 33-64 mL/sq-m)  CI: 2.09L/min/sq-m (Normal 1.9-3.9 L/min/sq-m)  Right ventricle:  RV female  RV EF: 54% (normal 52-80%)  Absolute volumes:  RV EDV: (Normal 79-175 mL)  RV ESV: 52mL (Normal 13-75 mL)  RV SV: 62mL (Normal 56-110 mL)  CO: 4.1L/min (Normal 2.7-6 L/min)  Indexed volumes:  RV EDV: 38mL/sq-m (Normal 51-97 mL/sq-m)  RV ESV: 66mL/sq-m (Normal 9-42 mL/sq-m)  RV SV: 31mL/sq-m (Normal 35-61 mL/sq-m)  CI: 2.16L/min/sq-m (Normal 1.8-3.8 L/min/sq-m)  IMPRESSION: 1. Normal biventricular chamber size and function. LVEF 55%, RVEF 54%.  2. No delayed myocardial enhancement. ECV 29%. No findings to suggest an scar, fibrosis, infiltrative or inflammatory process.   Electronically Signed By: Soyla Merck M.D. On: 06/03/2023 14:42          Laboratory Data:  High Sensitivity Troponin:   Recent Labs  Lab 08/29/23 1632 08/29/23 1708  TROPONINIHS 3 3      Chemistry Recent Labs  Lab 08/29/23 1632 08/30/23 1708  NA 140 140  K 3.7 3.9  CL 103 106  CO2 24 24  GLUCOSE 102* 131*  BUN 9 8  CREATININE 0.90 0.85  CALCIUM  9.5 9.6  MG 2.0  --   GFRNONAA >60 >60  ANIONGAP 13 10    Recent Labs  Lab 08/29/23 1632 08/30/23 1708  PROT 7.0 6.8  ALBUMIN 4.2 4.0  AST 26 18  ALT 23 22  ALKPHOS 117 108  BILITOT 0.7 0.7   Lipids No results  for input(s): CHOL, TRIG, HDL, LABVLDL, LDLCALC, CHOLHDL in the last 168 hours. Hematology Recent Labs  Lab 08/29/23 1632 08/30/23 1708  WBC 7.2 6.5  RBC 4.49 4.39  HGB 12.8 12.6  HCT 37.5 37.1  MCV 83.5 84.5  MCH 28.5 28.7  MCHC 34.1 34.0  RDW 12.8 12.8  PLT 282 266   Thyroid  No results for input(s): TSH, FREET4 in the last 168 hours. BNPNo results for input(s): BNP, PROBNP in the last 168 hours.  DDimer No results for input(s): DDIMER in the last 168 hours.   Radiology/Studies:  No results found.   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 Titrating cardizem . EP will see tomorrow for discussion regarding medical management and possibly moving up ablation.   Risk Assessment/Risk Scores:   Code Status: Full Code  Severity of Illness: The appropriate patient status for this patient is INPATIENT. Inpatient status is judged to be reasonable and necessary in order to provide the  required intensity of service to ensure the patient's safety. The patient's presenting symptoms, physical exam findings, and initial radiographic and laboratory data in the context of their chronic comorbidities is felt to place them at high risk for further clinical deterioration. Furthermore, it is not anticipated that the patient will be medically stable for discharge from the hospital within 2 midnights of admission.   * I certify that at the  point of admission it is my clinical judgment that the patient will require inpatient hospital care spanning beyond 2 midnights from the point of admission due to high intensity of service, high risk for further deterioration and high frequency of surveillance required.*   For questions or updates, please contact  [facility] Please consult [email or web address] for contact info under     Signed, Lenox Ladouceur, PA  08/30/2023 7:33 PM   Personally seen and examined. Agree with APP above with the following comments:  Marissa Barnes, a 58 year old with a history of significant ventricular tachycardia and marijuana abuse, presents with breakthrough ventricular tachycardia despite being on diltiazem . Recently, there was a misunderstanding with the electrophysiologist's advice to increase the diltiazem  dosage, leading to an unintentional overdose of 720mg . This resulted in an evaluation and monitoring, but the patient left against medical advice due to pet care responsibilities.  The patient's ventricular tachycardia, initially managed with diltiazem , has been worsening. There was a syncopal episode in 2024. The nature of the ventricular tachycardia appears to be monomorphic. There is no family history of sudden cardiac death or ventricular tachycardia. Cardiac MRI showed normal function, prominent apical fat, and fat over the right ventricle without evidence of RV fatty dysplasia, RV wall motion abnormalities, or late gadolinium enhancement.  The patient also reports feeling very anxious and experiencing palpitations. Physical examination revealed wheezes predominantly on the right and bilateral pitting edema. The patient also wears a right knee brace due to a distant past knee arthroscopy.  Family History: - No family history of sudden cardiac death, drowning, card accidents.  Exam  General:  Well nourished, well developed, in no acute distress  Neck: no JVD Cardiac:  normal S1, S2; RRR Lungs:   expiratory wheezing isolated to the right side, no tachypnea Abd: soft, non tender Ext: non pitting edema  LABS K: 3.9  RADIOLOGY Cardiac MRI: Normal function, prominent apical fat, fat over right ventricle without evidence of RV fatty dysplasia, no RV wall motion abnormalities, no late gadolinium enhancement  TELE: SR with non-pause dependent PVCs occasional in bigeminy; no NSVT, no VT  Ventricular Tachycardia 58 year old with significant VT, experiencing breakthrough episodes despite diltiazem . Cardiac MRI: normal function, prominent apical fat, no RV dysplasia or wall motion abnormalities.  - Start diltiazem  drip per electrophysiology recommendation - Administer 50 mg flecainide for breakthrough VT, as per discussion with Dr. Cindie - Follow-up with electrophysiology team in the morning  Anxiety - Anxiety potentially contributing to palpitations. - has PRN medications   Lower Extremity Edema Bilateral pitting edema; potassium 3.9.  General Health Maintenance - Significant marijuana abuse stopped since Christmas; - right knee brace post-arthroscopy. - clarification: she did not try to kill her self - clarification: she does not have documented atrial fibrillation.  - Continue DVT prophylaxis Full Code  Stanly Leavens, MD FASE [facility] Cardiologist [facility]  [address] [place], [address] [place], [place] [address] [phone]  7:52 PM

## 2023-08-31 DIAGNOSIS — I472 Ventricular tachycardia, unspecified: Secondary | ICD-10-CM | POA: Diagnosis not present

## 2023-08-31 LAB — BASIC METABOLIC PANEL
Anion gap: 7 (ref 5–15)
BUN: 11 mg/dL (ref 6–20)
CO2: 25 mmol/L (ref 22–32)
Calcium: 8.8 mg/dL — ABNORMAL LOW (ref 8.9–10.3)
Chloride: 106 mmol/L (ref 98–111)
Creatinine, Ser: 0.82 mg/dL (ref 0.44–1.00)
GFR, Estimated: >60 mL/min (ref >60)
Glucose, Bld: 117 mg/dL — ABNORMAL HIGH (ref 70–99)
Potassium: 3.6 mmol/L (ref 3.5–5.1)
Sodium: 138 mmol/L (ref 135–145)

## 2023-08-31 LAB — CBC
HCT: 34.2 % — ABNORMAL LOW (ref 36.0–46.0)
Hemoglobin: 11.7 g/dL — ABNORMAL LOW (ref 12.0–15.0)
MCH: 28.5 pg (ref 26.0–34.0)
MCHC: 34.2 g/dL (ref 30.0–36.0)
MCV: 83.4 fL (ref 80.0–100.0)
Platelets: 232 10*3/uL (ref 150–400)
RBC: 4.1 MIL/uL (ref 3.87–5.11)
RDW: 12.7 % (ref 11.5–15.5)
WBC: 7.2 10*3/uL (ref 4.0–10.5)
nRBC: 0 % (ref 0.0–0.2)

## 2023-08-31 LAB — LIPID PANEL
Cholesterol: 202 mg/dL — ABNORMAL HIGH (ref 0–200)
HDL: 42 mg/dL (ref >40)
LDL Cholesterol: 91 mg/dL (ref 0–99)
Total CHOL/HDL Ratio: 4.8 {ratio}
Triglycerides: 343 mg/dL — ABNORMAL HIGH (ref <150)
VLDL: 69 mg/dL — ABNORMAL HIGH (ref 0–40)

## 2023-08-31 NOTE — Progress Notes (Signed)
   Patient Name: Marissa Barnes Date of Encounter: 08/31/2023 Marissa Barnes Cardiologist: OLE ONEIDA HOLTS, MD   Interval Summary  .    Admitted last night for ongoing symptomatic VT. She was started on IV diltiazem . She has been in sinus rhythm overnight and this morning. Feeling relatively well. No new or acute complaints.  Vital Signs .    Vitals:   08/31/23 1200 08/31/23 1300 08/31/23 1400 08/31/23 1500  BP:      Pulse: 82 73 68 63  Resp: 17 16 20 10   Temp: 98.1 F (36.7 C)     TempSrc: Oral     SpO2: 97% 93% 90% 94%  Weight:      Height:        Intake/Output Summary (Last 24 hours) at 08/31/2023 1545 Last data filed at 08/31/2023 1200 Gross per 24 hour  Intake 360 ml  Output --  Net 360 ml      08/31/2023    3:46 AM 08/30/2023    9:05 PM 08/30/2023    5:04 PM  Last 3 Weights  Weight (lbs) 179 lb 10.8 oz 179 lb 3.7 oz 179 lb 3.7 oz  Weight (kg) 81.5 kg 81.3 kg 81.3 kg      Telemetry/ECG    Sinus rhythm, rare PVCs, no VT - Personally Reviewed  Physical Exam .   GEN: No acute distress.   Neck: No JVD Cardiac: Normal rate, regular rhythm Respiratory: Clear to auscultation bilaterally. GI: Soft, nontender, non-distended  MS: No edema  Assessment & Plan .     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 She continued to have episodes despite calcium  channel blocker and was advised to come to the ED.  #. Idiopathic VT: Reportedly same morphology as PVCs. #. PVCs: Outflow tract morphology - LBBB, V4 transition, inferior axis, indeterminate R/L axis. Late transition more suggestive of RVOT.  - Quiescent on IV cardizem , will continue for now.  - Will try to add on for PVC/VT ablation early this upcoming week.  For questions or updates, please contact [place] [facility] Please consult [email or web address] for contact info under        Signed, [name],  MD

## 2023-09-01 DIAGNOSIS — I472 Ventricular tachycardia, unspecified: Secondary | ICD-10-CM | POA: Diagnosis not present

## 2023-09-01 MED ORDER — ALPRAZOLAM 0.5 MG PO TABS
0.5000 mg | ORAL_TABLET | Freq: Two times a day (BID) | ORAL | Status: DC | PRN
  Administered 2023-09-01 (×2): 0.5 mg via ORAL
  Filled 2023-09-01 (×2): qty 1

## 2023-09-01 NOTE — Progress Notes (Signed)
   Patient Name: LYNANN DEMETRIUS Date of Encounter: 09/01/2023 [facility] Cardiologist: OLE ONEIDA HOLTS, MD   Interval Summary  .    No acute overnight events. VT remains quiescent on IV diltiazem . No new or acute complaints today.  Vital Signs .    Vitals:   08/31/23 1926 08/31/23 2242 09/01/23 0525 09/01/23 0745  BP: 129/85 130/72 139/89 125/73  Pulse:  65 60 62  Resp:  20 16 17   Temp: 97.8 F (36.6 C) 98 F (36.7 C) 98.3 F (36.8 C) 98.1 F (36.7 C)  TempSrc: Oral Oral Oral Oral  SpO2:  92% 93% 92%  Weight:   80.5 kg   Height:        Intake/Output Summary (Last 24 hours) at 09/01/2023 0941 Last data filed at 08/31/2023 1200 Gross per 24 hour  Intake 120 ml  Output --  Net 120 ml      09/01/2023    5:25 AM 08/31/2023    3:46 AM 08/30/2023    9:05 PM  Last 3 Weights  Weight (lbs) 177 lb 6.4 oz 179 lb 10.8 oz 179 lb 3.7 oz  Weight (kg) 80.468 kg 81.5 kg 81.3 kg      Telemetry/ECG    Sinus rhythm, rare PVCs, no VT - Personally Reviewed  Physical Exam .   GEN: No acute distress.   Neck: No JVD Cardiac: Normal rate, regular rhythm Respiratory: Clear to auscultation bilaterally. GI: Soft, nontender, non-distended  MS: No edema  Assessment & Plan .     Ms. 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 She continued to have episodes despite calcium  channel blocker and was advised to come to the ED.  #. Idiopathic VT: Reportedly same morphology as PVCs. #. PVCs: Outflow tract morphology - LBBB, V4 transition, inferior axis, indeterminate R/L axis. Late transition more suggestive of RVOT.  - Quiescent on IV cardizem , will continue for now.  - Will try to add on for PVC/VT ablation early this upcoming week. - NPO after midnight.  For questions or updates, please contact [facility] Please consult [email or web address] for contact info under      Signed, [name], MD

## 2023-09-01 NOTE — Plan of Care (Signed)

## 2023-09-02 ENCOUNTER — Inpatient Hospital Stay (HOSPITAL_COMMUNITY): Payer: Medicaid Other | Admitting: Anesthesiology

## 2023-09-02 ENCOUNTER — Inpatient Hospital Stay (HOSPITAL_COMMUNITY)
Admission: EM | Disposition: A | Discharge status: Home / Self Care | Payer: Self-pay | Source: Home / Self Care | Attending: Internal Medicine

## 2023-09-02 DIAGNOSIS — I472 Ventricular tachycardia, unspecified: Secondary | ICD-10-CM | POA: Diagnosis not present

## 2023-09-02 DIAGNOSIS — I10 Essential (primary) hypertension: Secondary | ICD-10-CM

## 2023-09-02 DIAGNOSIS — F418 Other specified anxiety disorders: Secondary | ICD-10-CM | POA: Diagnosis not present

## 2023-09-02 HISTORY — PX: V TACH ABLATION: EP1227

## 2023-09-02 LAB — CBC
HCT: 36.3 % (ref 36.0–46.0)
Hemoglobin: 12.5 g/dL (ref 12.0–15.0)
MCH: 28.2 pg (ref 26.0–34.0)
MCHC: 34.4 g/dL (ref 30.0–36.0)
MCV: 81.9 fL (ref 80.0–100.0)
Platelets: 201 10*3/uL (ref 150–400)
RBC: 4.43 MIL/uL (ref 3.87–5.11)
RDW: 12.6 % (ref 11.5–15.5)
WBC: 6.1 10*3/uL (ref 4.0–10.5)
nRBC: 0 % (ref 0.0–0.2)

## 2023-09-02 LAB — BASIC METABOLIC PANEL
Anion gap: 13 (ref 5–15)
BUN: 14 mg/dL (ref 6–20)
CO2: 24 mmol/L (ref 22–32)
Calcium: 9.2 mg/dL (ref 8.9–10.3)
Chloride: 102 mmol/L (ref 98–111)
Creatinine, Ser: 0.76 mg/dL (ref 0.44–1.00)
GFR, Estimated: >60 mL/min (ref >60)
Glucose, Bld: 133 mg/dL — ABNORMAL HIGH (ref 70–99)
Potassium: 4 mmol/L (ref 3.5–5.1)
Sodium: 139 mmol/L (ref 135–145)

## 2023-09-02 LAB — MAGNESIUM: Magnesium: 2.1 mg/dL (ref 1.7–2.4)

## 2023-09-02 SURGERY — V TACH ABLATION
Anesthesia: Monitor Anesthesia Care

## 2023-09-02 MED ORDER — SODIUM CHLORIDE 0.9 % IV SOLN: INTRAVENOUS | Status: DC | PRN

## 2023-09-02 MED ORDER — LIDOCAINE-EPINEPHRINE 1 %-1:100000 IJ SOLN
INTRAMUSCULAR | Status: DC | PRN
  Administered 2023-09-02: 10 mL

## 2023-09-02 MED ORDER — HEPARIN (PORCINE) IN NACL 1000-0.9 UT/500ML-% IV SOLN
INTRAVENOUS | Status: DC | PRN
  Administered 2023-09-02 (×2): 500 mL

## 2023-09-02 MED ORDER — DILTIAZEM HCL ER COATED BEADS 180 MG PO CP24
360.0000 mg | ORAL_CAPSULE | Freq: Every day | ORAL | Status: DC
  Administered 2023-09-02: 360 mg via ORAL
  Filled 2023-09-02: qty 2

## 2023-09-02 MED ORDER — BUPIVACAINE HCL (PF) 0.25 % IJ SOLN
INTRAMUSCULAR | Status: DC | PRN
  Administered 2023-09-02: 10 mL

## 2023-09-02 MED ORDER — SODIUM CHLORIDE 0.9 % IV SOLN: INTRAVENOUS | Status: DC

## 2023-09-02 MED ORDER — SODIUM CHLORIDE 0.9 % IV SOLN: 250.0000 mL | INTRAVENOUS | Status: DC | PRN

## 2023-09-02 MED ORDER — ISOPROTERENOL HCL 0.2 MG/ML IJ SOLN
INTRAMUSCULAR | Status: AC
  Filled 2023-09-02: qty 5

## 2023-09-02 MED ORDER — FENTANYL CITRATE (PF) 100 MCG/2ML IJ SOLN
INTRAMUSCULAR | Status: AC
  Filled 2023-09-02: qty 2

## 2023-09-02 MED ORDER — SODIUM CHLORIDE 0.9% FLUSH
3.0000 mL | Freq: Two times a day (BID) | INTRAVENOUS | Status: DC
  Administered 2023-09-02: 3 mL via INTRAVENOUS

## 2023-09-02 MED ORDER — SODIUM CHLORIDE 0.9% FLUSH: 3.0000 mL | INTRAVENOUS | Status: DC | PRN

## 2023-09-02 MED ORDER — CALCIUM CHLORIDE 10 % IV SOLN
INTRAVENOUS | Status: DC | PRN
  Administered 2023-09-02: 1 g via INTRAVENOUS

## 2023-09-02 MED ORDER — HEPARIN SODIUM (PORCINE) 1000 UNIT/ML IJ SOLN
INTRAMUSCULAR | Status: AC
  Filled 2023-09-02: qty 1

## 2023-09-02 MED ORDER — MIDAZOLAM HCL 5 MG/5ML IJ SOLN
INTRAMUSCULAR | Status: AC
Start: 2023-09-02 — End: ?
  Filled 2023-09-02: qty 5

## 2023-09-02 MED ORDER — LIDOCAINE-EPINEPHRINE 1 %-1:100000 IJ SOLN
INTRAMUSCULAR | Status: AC
  Filled 2023-09-02: qty 1

## 2023-09-02 MED ORDER — CALCIUM CHLORIDE 10 % IV SOLN
INTRAVENOUS | Status: AC
  Filled 2023-09-02: qty 10

## 2023-09-02 MED ORDER — ISOPROTERENOL HCL 0.2 MG/ML IJ SOLN
INTRAVENOUS | Status: DC | PRN
  Administered 2023-09-02: 1 ug/min via INTRAVENOUS

## 2023-09-02 MED ORDER — BUPIVACAINE HCL (PF) 0.25 % IJ SOLN
INTRAMUSCULAR | Status: AC
  Filled 2023-09-02: qty 60

## 2023-09-02 SURGICAL SUPPLY — 12 items
BAG SNAP BAND KOVER 36X36 (MISCELLANEOUS) IMPLANT
CATH JOSEPH QUAD ALLRED 6F REP (CATHETERS) IMPLANT
CATH SOUNDSTAR ECO 8FR (CATHETERS) IMPLANT
CLOSURE PERCLOSE PROSTYLE (VASCULAR PRODUCTS) IMPLANT
PACK EP LF (CUSTOM PROCEDURE TRAY) ×1 IMPLANT
PAD DEFIB RADIO PHYSIO CONN (PAD) ×1 IMPLANT
PATCH CARTO3 (PAD) IMPLANT
SHEATH PINNACLE 5F 10CM (SHEATH) IMPLANT
SHEATH PINNACLE 8F 10CM (SHEATH) IMPLANT
SHEATH PINNACLE 9F 10CM (SHEATH) IMPLANT
SHEATH PROBE COVER 6X72 (BAG) IMPLANT
TUBING SMART ABLATE COOLFLOW (TUBING) IMPLANT

## 2023-09-02 NOTE — Anesthesia Postprocedure Evaluation (Signed)
 Anesthesia Post Note  Patient: Marissa Barnes  Procedure(s) Performed: LULLA BOOM ABLATION     Patient location during evaluation: PACU Anesthesia Type: MAC Level of consciousness: awake and alert Pain management: pain level controlled Vital Signs Assessment: post-procedure vital signs reviewed and stable Respiratory status: spontaneous breathing, nonlabored ventilation and respiratory function stable Cardiovascular status: stable and blood pressure returned to baseline Postop Assessment: no apparent nausea or vomiting Anesthetic complications: no   There were no known notable events for this encounter.  Last Vitals:  Vitals:   09/02/23 1031 09/02/23 1114  BP: (!) 153/89 (!) 153/89  Pulse: 89   Resp: (!) 21   Temp:    SpO2: 96%     Last Pain:  Vitals:   09/02/23 1015  TempSrc: Oral  PainSc:    Pain Goal:                   Garnette FORBES Skillern

## 2023-09-02 NOTE — Discharge Instructions (Signed)

## 2023-09-02 NOTE — TOC Initial Note (Signed)
 Transition of Care [facility]) - Initial/Assessment Note    Patient Details  Name: Marissa Barnes MRN: [id number] Date of Birth: [birth year]  [facility]) CM/SW Contact:    Waddell Barnie Rama, RN Phone Number: 09/02/2023, 11:04 AM  Clinical Narrative:                 From home with daughter, has PCP, [facility] and insurance on file, states has no HH services in place at this time , has a walker and a cane at home from previous hip surgery. States daughter will transport her home at [employer] and she  is support system, states gets medications from [place] on [address] Pta self ambulatory.   S/p ablation this am, on bedrest for hours, then dc. Has no needs.   Expected Discharge Plan: Home/Self Care Barriers to Discharge: No Barriers Identified   Patient Goals and CMS Choice Patient states their goals for this hospitalization and ongoing recovery are:: return home   Choice offered to / list presented to : NA      Expected Discharge Plan and Services In-house Referral: NA Discharge Planning Services: CM Consult Post Acute Care Choice: NA Living arrangements for the past 2 months: Single Family Home Expected Discharge Date: 09/02/23               DME Arranged: N/A DME Agency: NA       HH Arranged: NA          Prior Living Arrangements/Services Living arrangements for the past 2 months: Single Family Home Lives with:: Adult Children (daughter) Patient language and need for interpreter reviewed:: Yes Do you feel safe going back to the place where you live?: Yes      Need for Family Participation in Patient Care: Yes (Comment) Care giver support system in place?: Yes (comment) Current home services: DME (walker, cane from hip surgery) Criminal Activity/Legal Involvement Pertinent to Current Situation/Hospitalization: No - Comment as needed  Activities of Daily Living   ADL Screening (condition at time of admission) Independently performs ADLs?: Yes  (appropriate for developmental age) Is the patient deaf or have difficulty hearing?: No Does the patient have difficulty seeing, even when wearing glasses/contacts?: No Does the patient have difficulty concentrating, remembering, or making decisions?: No  Permission Sought/Granted Permission sought to share information with : Case Manager Permission granted to share information with : Yes, Verbal Permission Granted              Emotional Assessment Appearance:: Appears stated age Attitude/Demeanor/Rapport: Engaged Affect (typically observed): Appropriate Orientation: : Oriented to Self, Oriented to Place, Oriented to  Time, Oriented to Situation Alcohol / Substance Use: Not Applicable Psych Involvement: No (comment)  Admission diagnosis:  Ventricular tachycardia (HCC) [I47.20] PVC (premature ventricular contraction) [I49.3] NSVT (nonsustained ventricular tachycardia) (HCC) [I47.29] Syncope, unspecified syncope type [R55] Patient Active Problem List   Diagnosis Date Noted   Ventricular tachycardia (HCC) 08/30/2023   Pure hypertriglyceridemia 06/02/2023   NSVT (nonsustained ventricular tachycardia) (HCC) 06/01/2023   PVC (premature ventricular contraction) 06/01/2023   Syncope 06/01/2023   Attention deficit hyperactivity disorder (ADHD) 06/01/2023   Depression 06/01/2023   Mixed hyperlipidemia 06/01/2023   Wide-complex tachycardia 05/31/2023   OA (osteoarthritis) of hip 05/20/2019   PCP:  [facility] Pharmacy:   [id number] [place] - [address] AT [facility] [address] [place] [address] Phone: [phone] Fax: [phone]  [facility] [address] [place] [address] Phone: [phone] Fax: [phone]     Social Drivers of Health (SDOH) Social History: SDOH Screenings   Food Insecurity: No Food Insecurity (08/30/2023)  Housing: High Risk (08/30/2023)   Transportation Needs: No Transportation Needs (08/30/2023)  Utilities: Not At Risk (08/30/2023)  Financial Resource Strain: Medium Risk (05/29/2023)   Received from [facility]  Physical Activity: Sufficiently Active (05/29/2023)   Received from [facility]  Social Connections: Moderately Integrated (05/29/2023)   Received from [facility]  Stress: No Stress Concern Present (05/29/2023)   Received [facility]  Tobacco Use: Low Risk  (08/30/2023)   SDOH Interventions:     Readmission Risk Interventions     No data to display

## 2023-09-02 NOTE — TOC Transition Note (Signed)
 Transition of Care [facility]) - Discharge Note   Patient Details  Name: Marissa Barnes MRN: [id number] Date of Birth: [birth year]  [facility]) CM/SW Contact:  Waddell Barnie Rama, RN Phone Number: 09/02/2023, 11:07 AM   Clinical Narrative:    For dc today, daughter will transport home. On bedrest for 4 hours s/p ablation, then dc.   Final next level of care: Home/Self Care Barriers to Discharge: No Barriers Identified   Patient Goals and CMS Choice Patient states their goals for this hospitalization and ongoing recovery are:: return home   Choice offered to / list presented to : NA      Discharge Placement                       Discharge Plan and Services Additional resources added to the After Visit Summary for   In-house Referral: NA Discharge Planning Services: CM Consult Post Acute Care Choice: NA          DME Arranged: N/A DME Agency: NA       HH Arranged: NA          Social Drivers of Health (SDOH) Interventions SDOH Screenings   Food Insecurity: No Food Insecurity (08/30/2023)  Housing: High Risk (08/30/2023)  Transportation Needs: No Transportation Needs (08/30/2023)  Utilities: Not At Risk (08/30/2023)  Financial Resource Strain: Medium Risk (05/29/2023)   Received from [facility]  Physical Activity: Sufficiently Active (05/29/2023)   Received from [facility]  Social Connections: Moderately Integrated (05/29/2023)   Received from [facility]  Stress: No Stress Concern Present (05/29/2023)   Received from [facility]  Tobacco Use: Low Risk  (08/30/2023)     Readmission Risk Interventions     No data to display

## 2023-09-02 NOTE — H&P (View-Only) (Signed)
  Patient Name: Marissa Barnes Date of Encounter: 09/02/2023  Primary Cardiologist: OLE ONEIDA HOLTS, MD Electrophysiologist: None  Interval Summary   Doing OK overnight on diltiazem  gtt.   OK with proceeding with VT/PVC ablation this am.   Vital Signs    Vitals:   09/01/23 1557 09/01/23 1945 09/01/23 2320 09/02/23 0608  BP: (!) 152/80 124/85 132/77 107/72  Pulse: 80 88 68 64  Resp: (!) 22 15 20 17   Temp: 99.2 F (37.3 C) 98.5 F (36.9 C) 98.1 F (36.7 C) 97.8 F (36.6 C)  TempSrc: Oral Oral Oral Oral  SpO2: 93% 96% 93% 93%  Weight:    80 kg  Height:        Intake/Output Summary (Last 24 hours) at 09/02/2023 0724 Last data filed at 09/02/2023 0300 Gross per 24 hour  Intake 959.15 ml  Output --  Net 959.15 ml   Filed Weights   08/31/23 0346 09/01/23 0525 09/02/23 0608  Weight: 81.5 kg 80.5 kg 80 kg    Physical Exam    GEN- The patient is well appearing, alert and oriented x 3 today.   Lungs- Clear to ausculation bilaterally, normal work of breathing Cardiac- Regular rate and rhythm, no murmurs, rubs or gallops GI- soft, NT, ND, + BS Extremities- no clubbing or cyanosis. No edema  Telemetry    NSR 60-80s (personally reviewed)  Hospital Course    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 She continued to have episodes despite calcium  channel blocker and was advised to come to the ED.   Assessment & Plan    Idiopathic VT PVCs Reportedly VT is same morphology as PVCs Outflow tract morphology - LBBB, V4 transition, inferior axis, indeterminate R/L axis. Late transition more suggestive of RVOT  Has been NPO.  Diltiazem  Stopped at 0700.   Will plan ablation today with Dr. Kennyth.   For questions or updates, please contact [facility] Please consult [email or web address] for contact info under Cardiology/STEMI.  Signed, Marissa Prentice Passey, PA-C  09/02/2023,  7:24 AM

## 2023-09-02 NOTE — Transfer of Care (Signed)
 Immediate Anesthesia Transfer of Care Note  Patient: Marissa Barnes  Procedure(s) Performed: Reina Cara ABLATION  Patient Location: Cath Lab  Anesthesia Type:MAC  Level of Consciousness: awake, alert , and oriented  Airway & Oxygen Therapy: Patient Spontanous Breathing  Post-op Assessment: Report given to RN and Post -op Vital signs reviewed and stable  Post vital signs: Reviewed and stable  Last Vitals:  Vitals Value Taken Time  BP    Temp    Pulse    Resp    SpO2      Last Pain:  Vitals:   09/02/23 0733  TempSrc: Oral  PainSc: 0-No pain         Complications: There were no known notable events for this encounter.

## 2023-09-02 NOTE — Discharge Summary (Signed)
 Discharge Summary    Patient ID: Marissa Barnes MRN: [id number]; DOB: [birth year]  Admit date: 08/30/2023 Discharge date: 09/02/2023  PCP:  [facility]   [facility] Providers Cardiologist:  OLE ONEIDA HOLTS, MD        Discharge Diagnoses    Principal Problem:   Ventricular tachycardia [facility])    Diagnostic Studies/Procedures    VT ablation 09/02/23 - non-inducible during procedure _____________   History of Present Illness     Marissa Barnes is a 58 y.o. female with  PMH of idiopathic PVC, VT in a structurally normal heart.   Hospital Course     She is well-known to EP servie, was planning for VT/PVC ablation 1/29. She called clinic with increase in symptoms, so was recommended to proceed to ER. She was started on IV diltiazem  with rare PVCs.  PVC/VT ablation was attempted 1/13 but they were not inducible during the procedure. She is being discharged on her home dose of 360mg  diltiazem  daily with plans to repeat ablation 1/29 with Dr. Holts      Did the patient have an acute coronary syndrome (MI, NSTEMI, STEMI, etc) this admission?:  No                               Did the patient have a percutaneous coronary intervention (stent / angioplasty)?:  No.          _____________  Discharge Vitals Blood pressure (!) 153/89, pulse 89, temperature 98.2 F (36.8 C), temperature source Oral, resp. rate (!) 21, height 5' 7 (1.702 m), weight 80 kg, SpO2 96%.  Filed Weights   08/31/23 0346 09/01/23 0525 09/02/23 0608  Weight: 81.5 kg 80.5 kg 80 kg    Labs & Radiologic Studies    CBC Recent Labs    08/31/23 0202 09/02/23 0733  WBC 7.2 6.1  HGB 11.7* 12.5  HCT 34.2* 36.3  MCV 83.4 81.9  PLT 232 201   Basic Metabolic Panel Recent Labs    98/89/74 1854 08/31/23 0202 09/02/23 0733  NA  --  138 139  K  --  3.6 4.0  CL  --  106 102  CO2  --  25 24  GLUCOSE  --  117* 133*  BUN  --  11 14  CREATININE  --  0.82 0.76  CALCIUM   --   8.8* 9.2  MG 2.2  --  2.1   Liver Function Tests Recent Labs    08/30/23 1708  AST 18  ALT 22  ALKPHOS 108  BILITOT 0.7  PROT 6.8  ALBUMIN 4.0   Recent Labs    08/30/23 1708  LIPASE 33   High Sensitivity Troponin:   Recent Labs  Lab 08/29/23 1632 08/29/23 1708 08/30/23 1854 08/30/23 2032  TROPONINIHS 3 3 4 4     BNP Invalid input(s): POCBNP D-Dimer No results for input(s): DDIMER in the last 72 hours. Hemoglobin A1C No results for input(s): HGBA1C in the last 72 hours. Fasting Lipid Panel Recent Labs    08/31/23 0202  CHOL 202*  HDL 42  LDLCALC 91  TRIG 343*  CHOLHDL 4.8   Thyroid  Function Tests No results for input(s): TSH, T4TOTAL, T3FREE, THYROIDAB in the last 72 hours.  Invalid input(s): FREET3 _____________  DG Chest 2 View Result Date: 08/29/2023 CLINICAL DATA:  Chest pain. EXAM: CHEST - 2 VIEW COMPARISON:  Chest radiograph dated 06/21/2023. FINDINGS: No focal  consolidation, pleural effusion, pneumothorax. The cardiac silhouette is within normal limits. No acute osseous pathology. IMPRESSION: No active cardiopulmonary disease. Electronically Signed   By: Vanetta Chou M.D.   On: 08/29/2023 16:46   LONG TERM MONITOR (3-14 DAYS) Result Date: 08/06/2023 HR 57 - 285, average 82 bpm. 1 nonsustained SVT lasting 4 beats. 18 nonsustained VT, longest 7 seconds with an average rate of 250 bpm. Occasional supraventricular ectopy. Rare ventricular ectopy. No atrial fibrillation. No sustained arrhythmias. Appointment made for patient to discuss EP study and ablation. Ole T. Cindie, MD, [facility], [facility] Cardiac Electrophysiology   Disposition   Pt is being discharged home today in good condition.  Follow-up Plans & Appointments    09/17/2022 - PVC / VT ablation with Dr. Cindie    Discharge Medications   Allergies as of 09/02/2023   No Known Allergies      Medication List     TAKE these medications    ALPRAZolam  0.5 MG  tablet Commonly known as: XANAX  Take 0.5 mg by mouth 2 (two) times daily as needed for anxiety.   amphetamine -dextroamphetamine  30 MG tablet Commonly known as: ADDERALL Take 15 mg by mouth 2 (two) times daily.   diltiazem  120 MG 24 hr capsule Commonly known as: CARDIZEM  CD Take 360 mg by mouth daily.   PARoxetine  40 MG tablet Commonly known as: PAXIL  Take 40 mg by mouth daily.           Outstanding Labs/Studies    Duration of Discharge Encounter: APP Time: 15 minutes   Signed, Bookert Guzzi, NP 09/02/2023, 2:45 PM

## 2023-09-02 NOTE — Interval H&P Note (Signed)
 History and Physical Interval Note:  09/02/2023 8:25 AM  Marissa Barnes  has presented today for surgery, with the diagnosis of vt.  The various methods of treatment have been discussed with the patient and family. After consideration of risks, benefits and other options for treatment, the patient has consented to  Procedure(s): V TACH ABLATION (N/A) as a surgical intervention.  The patient's history has been reviewed, patient examined, no change in status, stable for surgery.  I have reviewed the patient's chart and labs.  Questions were answered to the patient's satisfaction.     [name]

## 2023-09-02 NOTE — Progress Notes (Signed)
  Patient Name: Marissa Barnes Date of Encounter: 09/02/2023  Primary Cardiologist: Marissa ONEIDA HOLTS, MD Electrophysiologist: None  Interval Summary   Doing OK overnight on diltiazem  gtt.   OK with proceeding with VT/PVC ablation this am.   Vital Signs    Vitals:   09/01/23 1557 09/01/23 1945 09/01/23 2320 09/02/23 0608  BP: (!) 152/80 124/85 132/77 107/72  Pulse: 80 88 68 64  Resp: (!) 22 15 20 17   Temp: 99.2 F (37.3 C) 98.5 F (36.9 C) 98.1 F (36.7 C) 97.8 F (36.6 C)  TempSrc: Oral Oral Oral Oral  SpO2: 93% 96% 93% 93%  Weight:    80 kg  Height:        Intake/Output Summary (Last 24 hours) at 09/02/2023 0724 Last data filed at 09/02/2023 0300 Gross per 24 hour  Intake 959.15 ml  Output --  Net 959.15 ml   Filed Weights   08/31/23 0346 09/01/23 0525 09/02/23 0608  Weight: 81.5 kg 80.5 kg 80 kg    Physical Exam    GEN- The patient is well appearing, alert and oriented x 3 today.   Lungs- Clear to ausculation bilaterally, normal work of breathing Cardiac- Regular rate and rhythm, no murmurs, rubs or gallops GI- soft, NT, ND, + BS Extremities- no clubbing or cyanosis. No edema  Telemetry    NSR 60-80s (personally reviewed)  Hospital Course    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 She continued to have episodes despite calcium  channel blocker and was advised to come to the ED.   Assessment & Plan    Idiopathic VT PVCs Reportedly VT is same morphology as PVCs Outflow tract morphology - LBBB, V4 transition, inferior axis, indeterminate R/L axis. Late transition more suggestive of RVOT  Has been NPO.  Diltiazem  Stopped at 0700.   Will plan ablation today with Dr. Kennyth.   For questions or updates, please contact [facility] Please consult [email or web address] for contact info under Cardiology/STEMI.  Signed, Marissa Prentice Passey, PA-C  09/02/2023,  7:24 AM

## 2023-09-02 NOTE — Anesthesia Preprocedure Evaluation (Addendum)
 Anesthesia Evaluation  Patient identified by MRN, date of birth, ID band Patient awake    Reviewed: Allergy & Precautions, H&P , NPO status , Patient's Chart, lab work & pertinent test results  Airway Mallampati: III  TM Distance: >3 FB Neck ROM: Full    Dental  (+) Teeth Intact, Dental Advisory Given   Pulmonary neg pulmonary ROS   Pulmonary exam normal breath sounds clear to auscultation       Cardiovascular hypertension (144/79 preop), Normal cardiovascular exam+ dysrhythmias Ventricular Tachycardia  Rhythm:Regular Rate:Normal     Neuro/Psych  PSYCHIATRIC DISORDERS Anxiety Depression    negative neurological ROS     GI/Hepatic negative GI ROS,,,(+)       marijuana use  Endo/Other  negative endocrine ROS    Renal/GU negative Renal ROS  negative genitourinary   Musculoskeletal  (+) Arthritis , Osteoarthritis,    Abdominal   Peds negative pediatric ROS (+)  Hematology negative hematology ROS (+) Lab Results      Component                Value               Date                      WBC                      7.2                 08/31/2023                HGB                      11.7 (L)            08/31/2023                HCT                      34.2 (L)            08/31/2023                MCV                      83.4                08/31/2023                PLT                      232                 08/31/2023              Anesthesia Other Findings   Reproductive/Obstetrics negative OB ROS                             Anesthesia Physical Anesthesia Plan  ASA: 3  Anesthesia Plan: MAC   Post-op Pain Management:    Induction: Intravenous  PONV Risk Score and Plan: 2 and Propofol  infusion, Ondansetron , Dexamethasone  and Treatment may vary due to age or medical condition  Airway Management Planned: Simple Face Mask and Nasal Cannula  Additional Equipment: None  Intra-op  Plan:   Post-operative Plan:   Informed Consent: I have reviewed the patients  History and Physical, chart, labs and discussed the procedure including the risks, benefits and alternatives for the proposed anesthesia with the patient or authorized representative who has indicated his/her understanding and acceptance.     Dental advisory given  Plan Discussed with: CRNA  Anesthesia Plan Comments:        Anesthesia Quick Evaluation

## 2023-09-03 ENCOUNTER — Encounter (HOSPITAL_COMMUNITY): Payer: Self-pay | Admitting: Cardiology

## 2023-09-03 MED FILL — Heparin Sodium (Porcine) Inj 1000 Unit/ML: INTRAMUSCULAR | Qty: 30 | Status: AC

## 2023-09-17 NOTE — Pre-Procedure Instructions (Signed)
Instructed patient on the following items: Arrival time 0530 Nothing to eat or drink after midnight No meds AM of procedure Responsible person to drive you home and stay with you for 24 hrs

## 2023-09-18 ENCOUNTER — Other Ambulatory Visit: Payer: Self-pay

## 2023-09-18 ENCOUNTER — Encounter (HOSPITAL_COMMUNITY)
Admission: RE | Disposition: A | Discharge status: Home / Self Care | Payer: Self-pay | Source: Home / Self Care | Attending: Cardiology

## 2023-09-18 ENCOUNTER — Ambulatory Visit (HOSPITAL_COMMUNITY): Payer: Self-pay | Admitting: Registered Nurse

## 2023-09-18 ENCOUNTER — Ambulatory Visit (HOSPITAL_COMMUNITY): Admit: 2023-09-18 | Payer: Medicaid Other | Admitting: Cardiology

## 2023-09-18 ENCOUNTER — Encounter (HOSPITAL_COMMUNITY): Payer: Self-pay | Admitting: Cardiology

## 2023-09-18 ENCOUNTER — Encounter (HOSPITAL_COMMUNITY): Payer: Self-pay

## 2023-09-18 ENCOUNTER — Ambulatory Visit (HOSPITAL_COMMUNITY)
Admission: RE | Admit: 2023-09-18 | Discharge: 2023-09-18 | Disposition: A | Discharge status: Home / Self Care | Payer: Medicaid Other | Attending: Cardiology | Admitting: Cardiology

## 2023-09-18 DIAGNOSIS — I472 Ventricular tachycardia, unspecified: Secondary | ICD-10-CM | POA: Diagnosis not present

## 2023-09-18 DIAGNOSIS — R002 Palpitations: Secondary | ICD-10-CM | POA: Insufficient documentation

## 2023-09-18 DIAGNOSIS — Z79899 Other long term (current) drug therapy: Secondary | ICD-10-CM | POA: Insufficient documentation

## 2023-09-18 DIAGNOSIS — F418 Other specified anxiety disorders: Secondary | ICD-10-CM | POA: Diagnosis not present

## 2023-09-18 DIAGNOSIS — I10 Essential (primary) hypertension: Secondary | ICD-10-CM | POA: Diagnosis not present

## 2023-09-18 DIAGNOSIS — I493 Ventricular premature depolarization: Secondary | ICD-10-CM | POA: Insufficient documentation

## 2023-09-18 HISTORY — PX: V TACH ABLATION: EP1227

## 2023-09-18 SURGERY — V TACH ABLATION
Anesthesia: General

## 2023-09-18 MED ORDER — ISOPROTERENOL HCL 0.2 MG/ML IJ SOLN
INTRAMUSCULAR | Status: AC
  Filled 2023-09-18: qty 5

## 2023-09-18 MED ORDER — SODIUM CHLORIDE 0.9 % IV SOLN: INTRAVENOUS | Status: DC

## 2023-09-18 MED ORDER — ISOPROTERENOL HCL 0.2 MG/ML IJ SOLN
INTRAVENOUS | Status: DC | PRN
  Administered 2023-09-18: 2 ug/min via INTRAVENOUS

## 2023-09-18 SURGICAL SUPPLY — 2 items
PACK EP LF (CUSTOM PROCEDURE TRAY) ×1 IMPLANT
PAD DEFIB RADIO PHYSIO CONN (PAD) ×1 IMPLANT

## 2023-09-18 NOTE — Anesthesia Preprocedure Evaluation (Addendum)
Anesthesia Evaluation  Patient identified by MRN, date of birth, ID band Patient awake    Reviewed: Allergy & Precautions, NPO status , Patient's Chart, lab work & pertinent test results  Airway Mallampati: II  TM Distance: >3 FB Neck ROM: Full    Dental  (+) Teeth Intact, Dental Advisory Given   Pulmonary neg pulmonary ROS   Pulmonary exam normal breath sounds clear to auscultation       Cardiovascular hypertension, Pt. on medications Normal cardiovascular exam+ dysrhythmias Ventricular Tachycardia  Rhythm:Regular Rate:Normal     Neuro/Psych  PSYCHIATRIC DISORDERS Anxiety Depression    negative neurological ROS     GI/Hepatic negative GI ROS,,,(+)       marijuana use  Endo/Other  negative endocrine ROS    Renal/GU negative Renal ROS  negative genitourinary   Musculoskeletal  (+) Arthritis , Osteoarthritis,    Abdominal   Peds  Hematology negative hematology ROS (+)   Anesthesia Other Findings   Reproductive/Obstetrics negative OB ROS                             Anesthesia Physical Anesthesia Plan  ASA: 2  Anesthesia Plan: MAC   Post-op Pain Management: Tylenol PO (pre-op)*   Induction:   PONV Risk Score and Plan: 3 and Midazolam and Treatment may vary due to age or medical condition  Airway Management Planned: Natural Airway  Additional Equipment: None  Intra-op Plan:   Post-operative Plan:   Informed Consent: I have reviewed the patients History and Physical, chart, labs and discussed the procedure including the risks, benefits and alternatives for the proposed anesthesia with the patient or authorized representative who has indicated his/her understanding and acceptance.     Dental advisory given  Plan Discussed with: CRNA  Anesthesia Plan Comments: (Limited ectopy in preop on continuous EKG- per Dr. Lalla Brothers, will start w/ isoproterenol infusion to try to illicit  dysrhythmia, and if successful will proceed w/ versed/fentanyl light sedation. )        Anesthesia Quick Evaluation

## 2023-09-18 NOTE — Transfer of Care (Signed)
Immediate Anesthesia Transfer of Care Note  Patient: Marissa Barnes  Procedure(s) Performed: Floyce Stakes ABLATION  Patient Location: PACU  Anesthesia Type:MAC  Level of Consciousness: awake, alert , and oriented  Airway & Oxygen Therapy: Patient Spontanous Breathing  Post-op Assessment: Report given to RN and Post -op Vital signs reviewed and stable  Post vital signs: Reviewed and stable  Last Vitals:  Vitals Value Taken Time  BP 137/92   Temp    Pulse 85   Resp 12   SpO2 98     Last Pain:  Vitals:   09/18/23 0538  TempSrc: Oral         Complications: There were no known notable events for this encounter.

## 2023-09-18 NOTE — Anesthesia Postprocedure Evaluation (Signed)
Anesthesia Post Note  Patient: Marissa Barnes  Procedure(s) Performed: Floyce Stakes ABLATION     Patient location during evaluation: PACU Anesthesia Type: MAC Level of consciousness: awake and alert, oriented and patient cooperative Pain management: pain level controlled Vital Signs Assessment: post-procedure vital signs reviewed and stable Respiratory status: spontaneous breathing, nonlabored ventilation and respiratory function stable Cardiovascular status: blood pressure returned to baseline and stable Postop Assessment: no apparent nausea or vomiting Anesthetic complications: no   There were no known notable events for this encounter.  Last Vitals:  Vitals:   09/18/23 0814 09/18/23 0830  BP: 135/87 (!) 141/89  Pulse: 87 85  Resp: (!) 22 15  Temp:    SpO2: 99% 98%    Last Pain:  Vitals:   09/18/23 0814  TempSrc:   PainSc: 0-No pain                 Lannie Fields

## 2023-09-18 NOTE — H&P (Signed)
Electrophysiology Office Follow up Visit Note:     Date:  09/18/2023    ID:  Marissa Barnes, DOB Feb 12, 1966, MRN [id number]   PCP:  [facility]      [facility] Cardiologist:  None  [facility] Electrophysiologist:  None      Interval History:       Marissa Barnes is a 58 y.o. female who presents for a follow up visit.    I met the patient when she was hospitalized for VT. She was started on cardizem with improvement in burden and discharged (October 2024). She saw Brandi in clinic 07/09/2023. At that appointment, she reported ongoing episodes of presyncope. She reported continued THC and Adderall use at that appointment. A monitor was ordered which returned showing episodes of rapid VT.   When I saw the abnormal monitor, the patient's CCB was uptitrated. She presents today to discuss other treatment options.   Today she is doing well.  She tells me that when she was wearing the monitor she forgot to take her diltiazem for several doses and experienced worsening of her symptoms.  When she pressed the button on the monitor and this corresponded to ventricular tachycardia.  She has stopped smoking marijuana altogether and has reduced her Adderall to 15 mg by mouth twice daily.   Presents for PVC/VT ablation.    Objective Past medical, surgical, social and family history were reviewed.   ROS:   Please see the history of present illness.    All other systems reviewed and are negative.   EKGs/Labs/Other Studies Reviewed:     The following studies were reviewed today:   08/05/2023 Zio monitor personally reviewed HR 57 - 285, average 82 bpm. 1 nonsustained SVT lasting 4 beats. 18 nonsustained VT, longest 7 seconds with an average rate of 250 bpm. Occasional supraventricular ectopy. Rare ventricular ectopy. No atrial fibrillation. No sustained arrhythmias.   Cardiac MRI reviewed from October 14 shows normal EF, no scar.             Physical Exam:      VS:  BP 136/82 (BP Location: Right Arm, Patient Position: Sitting)   Pulse 75   Ht 5\' 7"  (1.702 m)   Wt 179 lb 3.2 oz (81.3 kg)   SpO2 97%   BMI 28.07 kg/m         Wt Readings from Last 3 Encounters:  08/08/23 179 lb 3.2 oz (81.3 kg)  07/09/23 179 lb 9.6 oz (81.5 kg)  06/21/23 169 lb 8.5 oz (76.9 kg)      GEN: no distress CARD: RRR, No MRG RESP: No IWOB. CTAB.     Assessment ASSESSMENT:     1. NSVT (nonsustained ventricular tachycardia) (HCC)   2. PVC (premature ventricular contraction)   3. Palpitations     PLAN:     In order of problems listed above:   #VT #Near syncope Structurally normal heart. No LGE on cMR. I suspect she is having runs of rapid outflow tract VT. She has PVCs that match the morphology of her VT. I have discussed treatment options including medical therapy and EPS with possible ablation.    Therapeutic strategies for ventricular tachycardia including medicine and ablation were discussed in detail with the patient today. Risk, benefits, and alternatives to EP study and radiofrequency ablation were also discussed in detail today. These risks include but are not limited to stroke, bleeding, vascular damage, tamponade, perforation, damage to the heart and other structures, AV  block requiring pacemaker, worsening renal function, and death. The patient understands these risk and wishes to proceed.  We will therefore proceed with catheter ablation at the next available time.   She will hold her diltiazem for 3 days prior to the procedure.    Presents for PVC/VT ablation today. Procedure reviewed.   Signed, Steffanie Dunn, MD, [facility], [facility] 09/18/2023 Electrophysiology [facility]

## 2023-10-16 NOTE — Progress Notes (Unsigned)
 Electrophysiology Office Note:   Date:  10/17/2023  ID:  GIA LUSHER, DOB 1966/04/21, MRN [id number]  Primary Cardiologist: Lanier Prude, MD Primary Heart Failure: None Electrophysiologist: None      History of Present Illness:   Marissa Barnes is a 58 y.o. female with h/o NSVT, VT, PVC's seen today for post hospital follow up.    She was previously hospitalized 05/2023 for VT.  She was started on cardizem with improvement in symptoms. She followed up in clinic 06/2023 and was having ongoing episodes of pre-syncope.  She had ongoing use of THC and Adderall at that time.  A monitor showed episodes of rapid VT.  The patients CCB was up titrated.  She was seen by Dr. Lalla Brothers in clinic and planned for VT ablation  Admitted 09/18/23 for planned VT ablation.   Since discharge from hospital the patient reports she has had several episodes of fast heart rates. She has quit THC for one month.  Is on reduced dose adderall.  Feels like the episodes are increasing.  She has been drinking Personal assistant energy drinks occasionally.  When episodes occur, she gets lightheaded & short of breath.   She denies chest pain, palpitations, dyspnea, PND, orthopnea, nausea, vomiting, dizziness, syncope, edema, weight gain, or early satiety.   Review of systems complete and found to be negative unless listed in HPI.   EP Information / Studies Reviewed:    EKG is ordered today. Personal review as below.  EKG Interpretation Date/Time:  Thursday October 17 2023 11:15:55 EST Ventricular Rate:  87 PR Interval:  138 QRS Duration:  82 QT Interval:  352 QTC Calculation: 423 R Axis:   88  Text Interpretation: Sinus rhythm with occasional Premature ventricular complexes Confirmed by Canary Brim ([id number]) on 10/17/2023 11:58:48 AM   Studies:  cMRI 05/2023 > normal EF and no scar  ZIO 07/2023 > 1 SVT lasting 4 beats, 18 NSVT lasting 7 seconds with an average of 250 bpm, occasional supraventricular  ectopy, no AF     Arrhythmia / AAD VT    Risk Assessment/Calculations:              Physical Exam:   VS:  BP 138/79 (BP Location: Left Arm, Patient Position: Sitting, Cuff Size: Normal)   Pulse 82   Ht 5\' 6"  (1.676 m)   Wt 171 lb (77.6 kg)   SpO2 96%   BMI 27.60 kg/m    Wt Readings from Last 3 Encounters:  10/17/23 171 lb (77.6 kg)  09/18/23 180 lb (81.6 kg)  09/02/23 176 lb 5.9 oz (80 kg)     GEN: Well nourished, well developed in no acute distress NECK: No JVD; No carotid bruits CARDIAC: Regular rate and rhythm, no murmurs, rubs, gallops RESPIRATORY:  Clear to auscultation without rales, wheezing or rhonchi  ABDOMEN: Soft, non-tender, non-distended EXTREMITIES:  No edema; No deformity   ASSESSMENT AND PLAN:    NSVT  PVC's Palpitations Thought to be outflow tract VT, morphology of PVC's match the morphology of her VT. EPS 1/13 & 1/29 with non-inducible VT -continue diltiazem  -add low dose Toprol 25 mg at bedtime  -monitored heart rhythm on EKG machine for duration of visit, screens of normal rhythm with only occ PVC, no VT/NSVT, or SVT -encouraged water intake / adequate hydration  -avoid energy drinks, THC -encouraged daily exercise for conditioning    Follow up with EP APP in 4 weeks  Signed, Canary Brim, NP-C, AGACNP-BC [facility] - Electrophysiology  10/17/2023, 12:26 PM

## 2023-10-17 ENCOUNTER — Ambulatory Visit: Payer: Medicaid Other | Attending: Pulmonary Disease | Admitting: Pulmonary Disease

## 2023-10-17 ENCOUNTER — Encounter: Payer: Self-pay | Admitting: Pulmonary Disease

## 2023-10-17 VITALS — BP 138/79 | HR 82 | Ht 66.0 in | Wt 171.0 lb

## 2023-10-17 DIAGNOSIS — I472 Ventricular tachycardia, unspecified: Secondary | ICD-10-CM | POA: Diagnosis not present

## 2023-10-17 DIAGNOSIS — R002 Palpitations: Secondary | ICD-10-CM | POA: Diagnosis not present

## 2023-10-17 DIAGNOSIS — R42 Dizziness and giddiness: Secondary | ICD-10-CM

## 2023-10-17 DIAGNOSIS — I493 Ventricular premature depolarization: Secondary | ICD-10-CM

## 2023-10-17 DIAGNOSIS — R000 Tachycardia, unspecified: Secondary | ICD-10-CM

## 2023-10-17 DIAGNOSIS — I4729 Other ventricular tachycardia: Secondary | ICD-10-CM

## 2023-10-17 MED ORDER — METOPROLOL SUCCINATE ER 25 MG PO TB24: 25.0000 mg | ORAL_TABLET | Freq: Every day | ORAL | 3 refills | Status: AC

## 2023-10-17 NOTE — Patient Instructions (Signed)
 Medication Instructions:  Start metoprolol succinate (Toprol XL) 25 mg at bedtime *If you need a refill on your cardiac medications before your next appointment, please call your pharmacy*  Lab Work: None ordered If you have labs (blood work) drawn today and your tests are completely normal, you will receive your results only by: MyChart Message (if you have MyChart) OR A paper copy in the mail If you have any lab test that is abnormal or we need to change your treatment, we will call you to review the results.  Follow-Up: At [facility], you and your health needs are our priority.  As part of our continuing mission to provide you with exceptional heart care, we have created designated Provider Care Teams.  These Care Teams include your primary Cardiologist (physician) and Advanced Practice Providers (APPs -  Physician Assistants and Nurse Practitioners) who all work together to provide you with the care you need, when you need it.  Your next appointment:   1 month(s)  Provider:   Canary Brim, NP   Other instructions: 1.Walk daily 2.Drink water

## 2023-11-14 NOTE — Progress Notes (Unsigned)
  Electrophysiology Office Note:   Date:  11/15/2023  ID:  Marissa Barnes, DOB Mar 30, 1966, MRN [id number]  Primary Cardiologist: Lanier Prude, MD Primary Heart Failure: None Electrophysiologist: None      History of Present Illness:   Marissa Barnes is a 58 y.o. female with h/o NSVT, VT, PVC's seen today for routine electrophysiology followup.   Two EP studies with no inducible VT.  Added low dose toprol at last visit with EP.   Since last being seen in our clinic the patient reports doing very well with the addition of Toprol. No further symptom burden.   She denies chest pain, palpitations, dyspnea, PND, orthopnea, nausea, vomiting, dizziness, syncope, edema, weight gain, or early satiety.   Review of systems complete and found to be negative unless listed in HPI.   EP Information / Studies Reviewed:    EKG is not ordered today. EKG from 10/17/23 reviewed which showed NSR 87 bpm      Studies:  cMRI 05/2023 > normal EF and no scar  ZIO 07/2023 > 1 SVT lasting 4 beats, 18 NSVT lasting 7 seconds with an average of 250 bpm, occasional supraventricular ectopy, no AF EPS 09/02/23 > SR on presentation, very rare PVC's, no inducible VT EPS 09/18/23 > non-inducible during   Arrhythmia / AAD VT / NSVT  SVT        Physical Exam:   VS:  BP 102/72   Pulse 65   Ht 5\' 6"  (1.676 m)   Wt 175 lb 9.6 oz (79.7 kg)   SpO2 98%   BMI 28.34 kg/m    Wt Readings from Last 3 Encounters:  11/15/23 175 lb 9.6 oz (79.7 kg)  10/17/23 171 lb (77.6 kg)  09/18/23 180 lb (81.6 kg)     GEN: Well nourished, well developed in no acute distress NECK: No JVD; No carotid bruits CARDIAC: Regular rate and rhythm - no PVC's on prolonged auscultation, no murmurs, rubs, gallops RESPIRATORY:  Clear to auscultation without rales, wheezing or rhonchi  ABDOMEN: Soft, non-tender, non-distended EXTREMITIES:  No edema; No deformity   ASSESSMENT AND PLAN:    NSVT  PVC's Palpitations Thought to be  outflow tract VT, morphology of PVC's match the morphology of her VT. EPS 1/13 & 1/29 with non-inducible VT -continue diltiazem  -tolerating Toprol 25mg  > feels much better -encouraged daily exercise for conditioning, adequate hydration, avoidance of potential triggers such as THC / energy drinks   Follow up with Dr. Lalla Brothers or EP APP in 6 months  Signed, Canary Brim, NP-C, AGACNP-BC [facility] - Electrophysiology  11/15/2023, 1:01 PM

## 2023-11-15 ENCOUNTER — Ambulatory Visit: Payer: Medicaid Other | Attending: Pulmonary Disease | Admitting: Pulmonary Disease

## 2023-11-15 ENCOUNTER — Encounter: Payer: Self-pay | Admitting: Pulmonary Disease

## 2023-11-15 VITALS — BP 102/72 | HR 65 | Ht 66.0 in | Wt 175.6 lb

## 2023-11-15 DIAGNOSIS — I4729 Other ventricular tachycardia: Secondary | ICD-10-CM | POA: Diagnosis not present

## 2023-11-15 DIAGNOSIS — I472 Ventricular tachycardia, unspecified: Secondary | ICD-10-CM | POA: Diagnosis not present

## 2023-11-15 DIAGNOSIS — R002 Palpitations: Secondary | ICD-10-CM | POA: Diagnosis not present

## 2023-11-15 DIAGNOSIS — I493 Ventricular premature depolarization: Secondary | ICD-10-CM | POA: Diagnosis not present

## 2023-11-15 NOTE — Patient Instructions (Signed)
 Medication Instructions:    Your physician recommends that you continue on your current medications as directed. Please refer to the Current Medication list given to you today.   *If you need a refill on your cardiac medications before your next appointment, please call your pharmacy*  Lab Work: NONE ORDERED  TODAY    If you have labs (blood work) drawn today and your tests are completely normal, you will receive your results only by: MyChart Message (if you have MyChart) OR A paper copy in the mail If you have any lab test that is abnormal or we need to change your treatment, we will call you to review the results.  Testing/Procedures: NONE ORDERED  TODAY    Follow-Up: At [facility], you and your health needs are our priority.  As part of our continuing mission to provide you with exceptional heart care, our providers are all part of one team.  This team includes your primary Cardiologist (physician) and Advanced Practice Providers or APPs (Physician Assistants and Nurse Practitioners) who all work together to provide you with the care you need, when you need it.  Your next appointment:    6 MONTHS   Provider:   Canary Brim, NP    We recommend signing up for the patient portal called "MyChart".  Sign up information is provided on this After Visit Summary.  MyChart is used to connect with patients for Virtual Visits (Telemedicine).  Patients are able to view lab/test results, encounter notes, upcoming appointments, etc.  Non-urgent messages can be sent to your provider as well.   To learn more about what you can do with MyChart, go to [email or web address].   Other Instructions       1st Floor: - Lobby - Registration  - Pharmacy  - Lab - Cafe  2nd Floor: - PV Lab - Diagnostic Testing (echo, CT, nuclear med)  3rd Floor: - Vacant  4th Floor: - TCTS (cardiothoracic surgery) - AFib Clinic - Structural Heart Clinic - Vascular Surgery  - Vascular  Ultrasound  5th Floor: - HeartCare Cardiology (general and EP) - Clinical Pharmacy for coumadin, hypertension, lipid, weight-loss medications, and med management appointments    Valet parking services will be available as well.

## 2024-08-10 ENCOUNTER — Other Ambulatory Visit: Payer: Self-pay | Admitting: Pulmonary Disease

## 2024-08-25 ENCOUNTER — Telehealth: Payer: Self-pay | Admitting: Pulmonary Disease

## 2024-08-25 NOTE — Telephone Encounter (Signed)
 Patient returned call to reschedule her 1/13 appt with Aniceto, NP to 2/9. She stated new appt was ok but that she was afraid of running out of refills for her diltiazem . Advised the diltiazem  was prescribed by a different provider but that I would ask Daphne if she could take it over.   *STAT* If patient is at the pharmacy, call can be transferred to refill team.   1. Which medications need to be refilled? (please list name of each medication and dose if known) diltiazem  (CARDIZEM  CD) 180 MG 24 hr capsule  2. Which pharmacy/location (including street and city if local pharmacy) is medication to be sent to? [id number] - [place], [place] - [address]  3. Do they need a 30 day or 90 day supply? 90 day    Patient states she took her last pill today.

## 2024-08-26 NOTE — Telephone Encounter (Signed)
 Marissa Barnes,   I am fine to refill her Cardizem .  Looks like we could make it easier on her and give her Cardizem  CD 360 mg (instead of two 180 mg tabs) daily.  90day supply is ok x1 as she needs to be seen in clinic.  Hopefully she was able to get rescheduled. Thanks!   Daphne

## 2024-08-27 ENCOUNTER — Encounter (HOSPITAL_COMMUNITY): Payer: Self-pay

## 2024-08-27 ENCOUNTER — Other Ambulatory Visit: Payer: Self-pay

## 2024-08-27 ENCOUNTER — Other Ambulatory Visit (HOSPITAL_COMMUNITY): Payer: Self-pay

## 2024-08-27 ENCOUNTER — Telehealth: Payer: Self-pay | Admitting: Pulmonary Disease

## 2024-08-27 MED ORDER — DILTIAZEM HCL ER BEADS 360 MG PO CP24
360.0000 mg | ORAL_CAPSULE | Freq: Every day | ORAL | 3 refills | Status: AC
  Filled 2024-08-27: qty 30, 30d supply, fill #0

## 2024-08-27 NOTE — Telephone Encounter (Signed)
 Refill sent to pharmacy.  Patient needs follow up appt with EP.    Daphne Barrack, NP-C, AGACNP-BC [facility] - Electrophysiology  08/27/2024, 1:11 PM

## 2024-08-27 NOTE — Telephone Encounter (Signed)
 See prior phone note.  Rx for diltiazem  sent to pharmacy at patient's request. Tablets changed to 360mg  (from 2 180 mg tablets) for patient ease.    Daphne Barrack, NP-C, AGACNP-BC [facility] - Electrophysiology  08/27/2024, 1:06 PM

## 2024-08-28 NOTE — Telephone Encounter (Signed)
 Patient is scheduled on 2/9 w/ EP APP.

## 2024-09-01 ENCOUNTER — Ambulatory Visit: Admitting: Pulmonary Disease

## 2024-09-08 ENCOUNTER — Other Ambulatory Visit (HOSPITAL_COMMUNITY): Payer: Self-pay

## 2024-09-28 ENCOUNTER — Ambulatory Visit: Admitting: Pulmonary Disease
